# Patient Record
Sex: Female | Born: 1970 | Race: Black or African American | Hispanic: No | Marital: Single | Smoking: Never smoker
Health system: Southern US, Community
[De-identification: ages and names within clinical notes are randomized; demographics above are authoritative.]

## PROBLEM LIST (undated history)

## (undated) DIAGNOSIS — F419 Anxiety disorder, unspecified: Secondary | ICD-10-CM

## (undated) DIAGNOSIS — I10 Essential (primary) hypertension: Secondary | ICD-10-CM

## (undated) HISTORY — PX: TUBAL LIGATION: SHX77

## (undated) HISTORY — DX: Anxiety disorder, unspecified: F41.9

## (undated) HISTORY — DX: Essential (primary) hypertension: I10

## (undated) HISTORY — PX: COLONOSCOPY: SHX174

---

## 1999-05-06 ENCOUNTER — Encounter: Payer: Self-pay | Admitting: Family Medicine

## 1999-05-06 ENCOUNTER — Ambulatory Visit (HOSPITAL_COMMUNITY): Admission: RE | Admit: 1999-05-06 | Discharge: 1999-05-06 | Payer: Self-pay | Admitting: Family Medicine

## 2002-06-08 ENCOUNTER — Encounter: Admission: RE | Admit: 2002-06-08 | Discharge: 2002-06-08 | Payer: Self-pay | Admitting: Obstetrics and Gynecology

## 2002-06-08 ENCOUNTER — Other Ambulatory Visit: Admission: RE | Admit: 2002-06-08 | Discharge: 2002-06-08 | Payer: Self-pay | Admitting: Obstetrics and Gynecology

## 2004-10-23 ENCOUNTER — Ambulatory Visit: Payer: Self-pay | Admitting: Family Medicine

## 2005-05-27 ENCOUNTER — Ambulatory Visit (HOSPITAL_COMMUNITY): Admission: RE | Admit: 2005-05-27 | Discharge: 2005-05-27 | Payer: Self-pay | Admitting: Obstetrics and Gynecology

## 2009-06-27 ENCOUNTER — Ambulatory Visit: Payer: Self-pay | Admitting: Internal Medicine

## 2009-07-25 ENCOUNTER — Ambulatory Visit: Payer: Self-pay | Admitting: Internal Medicine

## 2009-12-23 ENCOUNTER — Encounter (INDEPENDENT_AMBULATORY_CARE_PROVIDER_SITE_OTHER): Payer: Self-pay | Admitting: Family Medicine

## 2009-12-23 LAB — CONVERTED CEMR LAB
ALT: 8 units/L (ref 0–35)
AST: 19 units/L (ref 0–37)
Albumin: 4.5 g/dL (ref 3.5–5.2)
Alkaline Phosphatase: 37 units/L — ABNORMAL LOW (ref 39–117)
BUN: 14 mg/dL (ref 6–23)
Basophils Absolute: 0 10*3/uL (ref 0.0–0.1)
Basophils Relative: 0 % (ref 0–1)
CO2: 25 meq/L (ref 19–32)
Calcium: 9.9 mg/dL (ref 8.4–10.5)
Chloride: 105 meq/L (ref 96–112)
Cholesterol: 170 mg/dL (ref 0–200)
Creatinine, Ser: 0.65 mg/dL (ref 0.40–1.20)
Eosinophils Absolute: 0 10*3/uL (ref 0.0–0.7)
Eosinophils Relative: 1 % (ref 0–5)
Glucose, Bld: 98 mg/dL (ref 70–99)
HCT: 36.8 % (ref 36.0–46.0)
HDL: 64 mg/dL (ref >39)
Hemoglobin: 11.8 g/dL — ABNORMAL LOW (ref 12.0–15.0)
LDL Cholesterol: 95 mg/dL (ref 0–99)
Lymphocytes Relative: 28 % (ref 12–46)
Lymphs Abs: 1.5 10*3/uL (ref 0.7–4.0)
MCHC: 32.1 g/dL (ref 30.0–36.0)
MCV: 94.4 fL (ref 78.0–100.0)
Monocytes Absolute: 0.4 10*3/uL (ref 0.1–1.0)
Monocytes Relative: 7 % (ref 3–12)
Neutro Abs: 3.3 10*3/uL (ref 1.7–7.7)
Neutrophils Relative %: 64 % (ref 43–77)
Platelets: 311 10*3/uL (ref 150–400)
Potassium: 4.2 meq/L (ref 3.5–5.3)
RBC: 3.9 M/uL (ref 3.87–5.11)
RDW: 14.1 % (ref 11.5–15.5)
Sodium: 139 meq/L (ref 135–145)
TSH: 0.598 microintl units/mL (ref 0.350–4.500)
Total Bilirubin: 0.3 mg/dL (ref 0.3–1.2)
Total CHOL/HDL Ratio: 2.7
Total Protein: 6.7 g/dL (ref 6.0–8.3)
Triglycerides: 55 mg/dL (ref <150)
VLDL: 11 mg/dL (ref 0–40)
WBC: 5.2 10*3/uL (ref 4.0–10.5)

## 2010-08-22 NOTE — Group Therapy Note (Signed)
NAME:  Andrea Palmer, Andrea Palmer NO.:  [id number]   MEDICAL RECORD NO.:  [id number]          PATIENT TYPE:  WOC   LOCATION:  [facility]                   FACILITY:  [facility]   PHYSICIAN:  Tinnie Gens, MD        DATE OF BIRTH:  May 17, 1970   DATE OF SERVICE:                                    CLINIC NOTE   CHIEF COMPLAINT:  Yearly exam.   HISTORY OF PRESENT ILLNESS:  The patient is a 40 year old para 2-0-0-2 who  comes in today for a yearly exam.  Her last Pap smear was in March of 2004.  She has not been sexually active since that time, and has no complaints  today.   PAST MEDICAL HISTORY:  Negative.   PAST SURGICAL HISTORY:  BTL.   MEDICATIONS:  None.   ALLERGIES:  None known.   OBSTETRICAL HISTORY:  SVD x 2.   GYNECOLOGICAL HISTORY:  Status post BTL, regular cycles.  Bleeding is normal  for her.  She has a history of chlamydia as a teenager.   FAMILY HISTORY:  Her mom is deceased with coronary artery disease and COPD.  Family history of diabetes and hypertension.   SOCIAL HISTORY:  Positive tobacco, less than a half pack per day.  No  alcohol or drug use.   REVIEW OF SYSTEMS:  A 14-point review of systems was reviewed, and is  negative.   PHYSICAL EXAMINATION:  VITAL SIGNS:  Vital signs are as noted on the chart.  GENERAL:  She is a well-developed, well-nourished, black female in no acute  distress.  ABDOMEN:  Soft, nontender, nondistended.  GU:  She has normal external female genitalia.  The vagina is pink and  rugated.  The cervix is parous and without lesion.  The uterus is between 6  and 8 weeks' size, anteverted, firm, nontender.  The adnexa were without  mass or tenderness.   IMPRESSION:  Yearly exam with Pap.   PLAN:  1.  Pap smear today.  2.  Follow up for yearly Paps or with any problems.  3.  The patient will be scheduled for a mammogram sometime between 34 and      39.       TP/MEDQ  D:  10/23/2004  T:  10/23/2004  Job:  [id number]

## 2013-04-17 ENCOUNTER — Other Ambulatory Visit (HOSPITAL_COMMUNITY)
Admission: RE | Admit: 2013-04-17 | Discharge: 2013-04-17 | Disposition: A | Discharge status: Home / Self Care | Payer: BC Managed Care – PPO | Source: Ambulatory Visit | Attending: Emergency Medicine | Admitting: Emergency Medicine

## 2013-04-17 ENCOUNTER — Emergency Department (HOSPITAL_COMMUNITY)
Admission: EM | Admit: 2013-04-17 | Discharge: 2013-04-17 | Disposition: A | Discharge status: Home / Self Care | Payer: BC Managed Care – PPO | Source: Home / Self Care | Attending: Emergency Medicine | Admitting: Emergency Medicine

## 2013-04-17 ENCOUNTER — Encounter (HOSPITAL_COMMUNITY): Payer: Self-pay | Admitting: Emergency Medicine

## 2013-04-17 DIAGNOSIS — Z113 Encounter for screening for infections with a predominantly sexual mode of transmission: Secondary | ICD-10-CM | POA: Insufficient documentation

## 2013-04-17 DIAGNOSIS — N898 Other specified noninflammatory disorders of vagina: Secondary | ICD-10-CM

## 2013-04-17 DIAGNOSIS — N760 Acute vaginitis: Secondary | ICD-10-CM | POA: Insufficient documentation

## 2013-04-17 LAB — POCT URINALYSIS DIP (DEVICE)
Bilirubin Urine: NEGATIVE
Glucose, UA: NEGATIVE mg/dL
Hgb urine dipstick: NEGATIVE
Ketones, ur: NEGATIVE mg/dL
Leukocytes, UA: NEGATIVE
Nitrite: NEGATIVE
Protein, ur: NEGATIVE mg/dL
Specific Gravity, Urine: 1.01 (ref 1.005–1.030)
Urobilinogen, UA: 0.2 mg/dL (ref 0.0–1.0)
pH: 7 (ref 5.0–8.0)

## 2013-04-17 LAB — POCT PREGNANCY, URINE: Preg Test, Ur: NEGATIVE

## 2013-04-17 LAB — RPR: RPR Ser Ql: NONREACTIVE

## 2013-04-17 LAB — HIV ANTIBODY (ROUTINE TESTING W REFLEX): HIV: NONREACTIVE

## 2013-04-17 NOTE — ED Notes (Signed)
C/o vaginal discharge.  Dryness.  Concerns for std's.   Hx of recurrent yeast and bacterial infection.  Denies pelvic or abdominal pain.  Pt would also like to have std blood work.

## 2013-04-17 NOTE — ED Provider Notes (Signed)
Medical screening examination/treatment/procedure(s) were performed by non-physician practitioner and as supervising physician I was immediately available for consultation/collaboration.  Philipp Deputy, M.D.  Harden Mo, MD 04/17/13 [phone]

## 2013-04-17 NOTE — ED Provider Notes (Signed)
CSN: [id number]     Arrival date & time 04/17/13  1059 History   First MD Initiated Contact with Patient 04/17/13 1242     Chief Complaint  Patient presents with  . Vaginal Discharge   (Consider location/radiation/quality/duration/timing/severity/associated sxs/prior Treatment) HPI Comments: Patient described 3-4 weeks of a "dry, yellow discharge." Denies irregular vaginal bleeding, abdominal or pelvic pain, fever or nausea. No genital lesions. G2P2, s/p BTL.   Patient is a 43 y.o. female presenting with vaginal discharge. The history is provided by the patient.  Vaginal Discharge   History reviewed. No pertinent past medical history. History reviewed. No pertinent past surgical history. History reviewed. No pertinent family history. History  Substance Use Topics  . Smoking status: Never Smoker   . Smokeless tobacco: Not on file  . Alcohol Use: No   OB History   Grav Para Term Preterm Abortions TAB SAB Ect Mult Living                 Review of Systems  Genitourinary: Positive for vaginal discharge.  All other systems reviewed and are negative.    Allergies  Review of patient's allergies indicates no known allergies.  Home Medications  No current outpatient prescriptions on file. BP 142/76  Pulse 70  Temp(Src) 97.6 F (36.4 C) (Oral)  Resp 16  SpO2 100%  LMP 03/22/2013 Physical Exam  Constitutional: She is oriented to person, place, and time. She appears well-developed and well-nourished. No distress.  HENT:  Head: Normocephalic and atraumatic.  Eyes: Conjunctivae are normal. Right eye exhibits no discharge. Left eye exhibits no discharge.  Neck: Normal range of motion. Neck supple.  Cardiovascular: Normal rate.   Pulmonary/Chest: Effort normal. No respiratory distress.  Abdominal: Soft. She exhibits no distension and no mass. There is no tenderness.  Genitourinary: Vagina normal and uterus normal. Pelvic exam was performed with patient supine. There is no rash,  tenderness or lesion on the right labia. There is no rash, tenderness or lesion on the left labia. Cervix exhibits no motion tenderness, no discharge and no friability. Right adnexum displays no mass, no tenderness and no fullness. Left adnexum displays no mass, no tenderness and no fullness.  Musculoskeletal: Normal range of motion.  Neurological: She is alert and oriented to person, place, and time.  Skin: Skin is warm and dry. No rash noted.  Psychiatric: She has a normal mood and affect. Her behavior is normal.    ED Course  Procedures (including critical care time) Labs Review Labs Reviewed  RPR  HIV ANTIBODY (ROUTINE TESTING)  CERVICOVAGINAL ANCILLARY ONLY   Imaging Review No results found.  EKG Interpretation    Date/Time:    Ventricular Rate:    PR Interval:    QRS Duration:   QT Interval:    QTC Calculation:   R Axis:     Text Interpretation:              MDM  Vaginal specimens collected. Patient wishes to wait on results to determine if treatment necessary. Instructed patient she would be notified by phone if results indicated need for treatment.     [place], [place] 04/17/13 1311

## 2013-04-20 ENCOUNTER — Telehealth (HOSPITAL_COMMUNITY): Payer: Self-pay | Admitting: Physician Assistant

## 2013-04-20 MED ORDER — METRONIDAZOLE 500 MG PO TABS: 500.0000 mg | ORAL_TABLET | Freq: Two times a day (BID) | ORAL | Status: DC

## 2013-04-20 NOTE — Telephone Encounter (Signed)
Message copied by Lutricia Feil on Thu Apr 20, 2013  8:09 AM ------      Message from: Roselyn Meier      Created: Wed Apr 19, 2013  9:44 PM      Regarding: labs       Gardnerella pos. Rest of labs neg. You had sent me a message that we discussed last night. You decided to treat after reviewing your note.      Roselyn Meier      04/19/2013       ------

## 2013-04-20 NOTE — Telephone Encounter (Signed)
Patient seen for vaginal discharge and cultures +for BV. Will prescribe metronidazole 500mg  po BID x 7 days and advised GYN follow up if no improvement.

## 2013-04-23 ENCOUNTER — Telehealth (HOSPITAL_COMMUNITY): Payer: Self-pay | Admitting: *Deleted

## 2013-04-23 NOTE — ED Notes (Addendum)
GC/Chlamydia neg., Affirm: Candida and Trich neg., Gardnerella pos., HIV/RPR non-reactive.  Dot Been PA wrote that pt. can be treated if symptomatic.  I called pt. and left a message to call. Call 1. 04/23/13 I called pt. Pt. verified x 2 and given results.  Pt. said she does not have any symptoms now. I told her if she notes symptoms this week to call back and I could call in the Rx. After 1 week, she would need to be rechecked. Pt. voiced understanding. Roselyn Meier 04/24/2013

## 2014-02-05 ENCOUNTER — Encounter: Payer: Self-pay | Admitting: Family

## 2014-02-05 ENCOUNTER — Ambulatory Visit (INDEPENDENT_AMBULATORY_CARE_PROVIDER_SITE_OTHER): Payer: BC Managed Care – PPO | Admitting: Family

## 2014-02-05 VITALS — BP 110/88 | HR 67 | Temp 98.1°F | Resp 18 | Ht 62.0 in | Wt 127.1 lb

## 2014-02-05 DIAGNOSIS — N63 Unspecified lump in breast: Secondary | ICD-10-CM | POA: Diagnosis not present

## 2014-02-05 DIAGNOSIS — N6325 Unspecified lump in the left breast, overlapping quadrants: Secondary | ICD-10-CM | POA: Insufficient documentation

## 2014-02-05 DIAGNOSIS — N6320 Unspecified lump in the left breast, unspecified quadrant: Principal | ICD-10-CM

## 2014-02-05 NOTE — Patient Instructions (Signed)
Thank you for choosing [facility].  Summary/Instructions:   Please schedule a time for a physical/PAP smear  We will schedule a time for your mammogram at your physical - keep track of lump at this time.

## 2014-02-05 NOTE — Progress Notes (Signed)
Pre visit review using our clinic review tool, if applicable. No additional management support is needed unless otherwise documented below in the visit note. 

## 2014-02-05 NOTE — Assessment & Plan Note (Signed)
Most likely cycle related and is benign. No appreciable lump/mass noted on exam. Continue to monitor if lump returns or increases in size. Will order preventative mammorgram during physical in 2 weeks. Continue to monitor. Follow before 2 weeks if symptoms worsen or fail to improve.

## 2014-02-05 NOTE — Progress Notes (Signed)
   Subjective:    Patient ID: Andrea Palmer, female    DOB: 01-09-1971, 43 y.o.   MRN: [id number]  Chief Complaint  Patient presents with  . Establish Care    has lump on breast that she wants to get checked    HPI:  Andrea Palmer is a 43 y.o. female who presents today to establish care and discuss a lump in her breast.    1) Lump in breast - first noticed last Thursday when a grand-baby was pushing on her breast. Describes breast soreness and noticed a small lump which has decreased since then. Notes finishing her period last week and decrease in size coincides with this. Lump was noted at approximately 3 o'clock position on left breast. Describes the lump as discrete and mobile. Denies any dimpling of breast, changes in skin, or discharge from nipple. Does note some soreness of breasts bilaterally. Has not done any treatments, and notes there is nothing that makes it better or worse.   No Known Allergies  No current outpatient prescriptions on file prior to visit.   No current facility-administered medications on file prior to visit.   No past medical history on file.   Review of Systems    See HPI  Objective:    BP 110/88 mmHg  Pulse 67  Temp(Src) 98.1 F (36.7 C) (Oral)  Resp 18  Ht 5\' 2"  (1.575 m)  Wt 127 lb 1.9 oz (57.661 kg)  BMI 23.24 kg/m2  SpO2 98% Nursing note and vital signs reviewed.  Physical Exam  Constitutional: She is oriented to person, place, and time. She appears well-developed and well-nourished. No distress.  Cardiovascular: Normal rate, regular rhythm, normal heart sounds and intact distal pulses.   Pulmonary/Chest: Effort normal and breath sounds normal. Right breast exhibits no inverted nipple, no mass, no nipple discharge, no skin change and no tenderness. Left breast exhibits no inverted nipple, no mass, no nipple discharge, no skin change and no tenderness.  No appreciable lump noted bilaterally.  Neurological: She is alert and oriented to  person, place, and time.  Skin: Skin is warm and dry.  Psychiatric: She has a normal mood and affect. Her behavior is normal. Judgment and thought content normal.       Assessment & Plan:

## 2014-02-15 ENCOUNTER — Encounter: Payer: BC Managed Care – PPO | Admitting: Family

## 2014-03-06 ENCOUNTER — Other Ambulatory Visit (HOSPITAL_COMMUNITY): Payer: Self-pay | Admitting: Obstetrics and Gynecology

## 2014-03-06 DIAGNOSIS — Z1231 Encounter for screening mammogram for malignant neoplasm of breast: Secondary | ICD-10-CM

## 2014-03-22 ENCOUNTER — Ambulatory Visit (HOSPITAL_COMMUNITY): Payer: BC Managed Care – PPO

## 2014-04-05 ENCOUNTER — Ambulatory Visit (HOSPITAL_COMMUNITY)
Admission: RE | Admit: 2014-04-05 | Discharge: 2014-04-05 | Disposition: A | Discharge status: Home / Self Care | Payer: BC Managed Care – PPO | Source: Ambulatory Visit | Attending: Obstetrics and Gynecology | Admitting: Obstetrics and Gynecology

## 2014-04-05 DIAGNOSIS — Z1231 Encounter for screening mammogram for malignant neoplasm of breast: Secondary | ICD-10-CM

## 2017-04-29 ENCOUNTER — Other Ambulatory Visit: Payer: Self-pay

## 2017-04-29 ENCOUNTER — Encounter: Payer: Self-pay | Admitting: Family Medicine

## 2017-04-29 ENCOUNTER — Ambulatory Visit: Payer: BLUE CROSS/BLUE SHIELD | Admitting: Family Medicine

## 2017-04-29 VITALS — BP 130/84 | HR 80 | Temp 98.2°F | Resp 18 | Ht 63.0 in | Wt 125.8 lb

## 2017-04-29 DIAGNOSIS — N938 Other specified abnormal uterine and vaginal bleeding: Secondary | ICD-10-CM

## 2017-04-29 DIAGNOSIS — R61 Generalized hyperhidrosis: Secondary | ICD-10-CM

## 2017-04-29 DIAGNOSIS — Z136 Encounter for screening for cardiovascular disorders: Secondary | ICD-10-CM | POA: Diagnosis not present

## 2017-04-29 DIAGNOSIS — Z23 Encounter for immunization: Secondary | ICD-10-CM | POA: Diagnosis not present

## 2017-04-29 LAB — POCT URINALYSIS DIP (MANUAL ENTRY)
BILIRUBIN UA: NEGATIVE mg/dL
Bilirubin, UA: NEGATIVE
Glucose, UA: NEGATIVE mg/dL
LEUKOCYTES UA: NEGATIVE
NITRITE UA: POSITIVE — AB
PROTEIN UA: NEGATIVE mg/dL
Spec Grav, UA: 1.015 (ref 1.010–1.025)
UROBILINOGEN UA: 0.2 U/dL
pH, UA: 7 (ref 5.0–8.0)

## 2017-04-29 LAB — POCT URINE PREGNANCY: Preg Test, Ur: NEGATIVE

## 2017-04-29 LAB — POC MICROSCOPIC URINALYSIS (UMFC): Mucus: ABSENT

## 2017-04-29 NOTE — Progress Notes (Signed)
Subjective:    Patient ID: Andrea Palmer, female    DOB: 15-Dec-1970, 47 y.o.   MRN: [id number] Chief Complaint  Patient presents with  . Establish Care    Pt states she would like to get to know the provider and would like discuss menopause.    HPI  Andrea Palmer is a 47 yo woman here to establish care.   She missed her period in December but now she started it yesterday but is very light which is atypical for her.  She started having some hot flashes last month during the time where she should have had her period.  The hot flashes would last for 3-4 min and then be cold. She wasn't sweating a lot during these. She did develop night sweats where she is just drenched 3-4 yrs ago but was told 2 yrs ago that she was NOT perimenopausal from the lining of her uterus by her gyn.  She doesn't feel "balanced" today. No h/o any prior abnml pap smears, but does have recurrent yeast infections right before the period.  Is s/p BTL.  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 No breast tenderness. Has had some mood changes where she tends to get a little more sad right before her period.  No changes in hair or skin.  Is constipated at baseline her whole life - most notably after she had kids If fasting today.   Works as a Probation officer.   Anxiety - never needed any meds or therapy prior.   No h/o any pulm or cardiac problems or sxs. Normal urine.  Takes pampirin prn for periods or asa but no other supp/herbals/otc meds.  Doesn't like meds or drs much.   Mother had a hysterectomy so don't know about her menopause hx but her older  Periods come every 28d - occ up to 26-30d - and lasts for 7d-10d though the lasts days are spottig - only 2-3 days of heavy flow.    Past Medical History:  Diagnosis Date  . Anxiety    Past Surgical History:  Procedure Laterality Date  . TUBAL LIGATION     No current outpatient medications on file prior  to visit.   No current facility-administered medications on file prior to visit.    No Known Allergies Family History  Problem Relation Age of Onset  . Congestive Heart Failure Mother    Social History   Socioeconomic History  . Marital status: Single    Spouse name: None  . Number of children: 2  . Years of education: 26  . Highest education level: None  Social Needs  . Financial resource strain: None  . Food insecurity - worry: None  . Food insecurity - inability: None  . Transportation needs - medical: None  . Transportation needs - non-medical: None  Occupational History  . Occupation: Hair stylist   Tobacco Use  . Smoking status: Never Smoker  . Smokeless tobacco: Never Used  Substance and Sexual Activity  . Alcohol use: No    Alcohol/week: 0.0 oz  . Drug use: No  . Sexual activity: Yes    Partners: Male    Birth control/protection: None  Other Topics Concern  . None  Social History Narrative   Born and raised in [place]. Currently resides in an apartment with her son. Has a rabbit. Fun: watch tv (law and order; criminal minds); Denies any religious beliefs that would  effect health care.   Depression screen PHQ 2/9 04/29/2017  Decreased Interest 0  Down, Depressed, Hopeless 0  PHQ - 2 Score 0    Review of Systems See hpi    Objective:   Physical Exam  Constitutional: She is oriented to person, place, and time. She appears well-developed and well-nourished. No distress.  HENT:  Head: Normocephalic and atraumatic.  Right Ear: External ear normal.  Left Ear: External ear normal.  Eyes: Conjunctivae are normal. No scleral icterus.  Neck: Normal range of motion. Neck supple. No thyromegaly present.  Cardiovascular: Normal rate, regular rhythm, normal heart sounds and intact distal pulses.  Pulmonary/Chest: Effort normal and breath sounds normal. No respiratory distress.  Musculoskeletal: She exhibits no edema.  Lymphadenopathy:    She has no cervical  adenopathy.  Neurological: She is alert and oriented to person, place, and time.  Skin: Skin is warm and dry. She is not diaphoretic. No erythema.  Psychiatric: She has a normal mood and affect. Her behavior is normal.      BP 130/84 (BP Location: Left Arm, Patient Position: Sitting, Cuff Size: Normal)   Pulse 80   Temp 98.2 F (36.8 C) (Oral)   Resp 18   Ht 5\' 3"  (1.6 m)   Wt 125 lb 12.8 oz (57.1 kg)   LMP 02/13/2017   SpO2 99%   BMI 22.28 kg/m      Assessment & Plan:   1. Dysfunctional uterine bleeding   2. Need for Tdap vaccination   3. Night sweats   4. Screening for cardiovascular condition     Orders Placed This Encounter  Procedures  . Tdap vaccine greater than or equal to 7yo IM  . CBC with Differential/Platelet  . Comprehensive metabolic panel  . TSH  . FSH/LH  . Prolactin  . Lipid panel    Order Specific Question:   Has the patient fasted?    Answer:   Yes  . POCT urine pregnancy  . POCT urinalysis dipstick  . POCT Microscopic Urinalysis ([facility])     Delman Cheadle, M.D.  Primary Care at [facility] [address], [place] [address] [phone] phone [phone] fax  04/29/17 11:40 AM

## 2017-04-29 NOTE — Patient Instructions (Addendum)
   IF you received an x-ray today, you will receive an invoice from [facility]. Please contact [facility] at [phone] with questions or concerns regarding your invoice.   IF you received labwork today, you will receive an invoice from [facility]. Please contact [facility] at [phone] with questions or concerns regarding your invoice.   Our billing staff will not be able to assist you with questions regarding bills from these companies.  You will be contacted with the lab results as soon as they are available. The fastest way to get your results is to activate your My Chart account. Instructions are located on the last page of this paperwork. If you have not heard from us regarding the results in 2 weeks, please contact this office.    Perimenopause Perimenopause is the time when your body begins to move into the menopause (no menstrual period for 12 straight months). It is a natural process. Perimenopause can begin 2-8 years before the menopause and usually lasts for 1 year after the menopause. During this time, your ovaries may or may not produce an egg. The ovaries vary in their production of estrogen and progesterone hormones each month. This can cause irregular menstrual periods, difficulty getting pregnant, vaginal bleeding between periods, and uncomfortable symptoms. What are the causes?  Irregular production of the ovarian hormones, estrogen and progesterone, and not ovulating every month. Other causes include:  Tumor of the pituitary gland in the brain.  Medical disease that affects the ovaries.  Radiation treatment.  Chemotherapy.  Unknown causes.  Heavy smoking and excessive alcohol intake can bring on perimenopause sooner.  What are the signs or symptoms?  Hot flashes.  Night sweats.  Irregular menstrual periods.  Decreased sex drive.  Vaginal dryness.  Headaches.  Mood swings.  Depression.  Memory  problems.  Irritability.  Tiredness.  Weight gain.  Trouble getting pregnant.  The beginning of losing bone cells (osteoporosis).  The beginning of hardening of the arteries (atherosclerosis). How is this diagnosed? Your health care provider will make a diagnosis by analyzing your age, menstrual history, and symptoms. He or she will do a physical exam and note any changes in your body, especially your female organs. Female hormone tests may or may not be helpful depending on the amount of female hormones you produce and when you produce them. However, other hormone tests may be helpful to rule out other problems. How is this treated? In some cases, no treatment is needed. The decision on whether treatment is necessary during the perimenopause should be made by you and your health care provider based on how the symptoms are affecting you and your lifestyle. Various treatments are available, such as:  Treating individual symptoms with a specific medicine for that symptom.  Herbal medicines that can help specific symptoms.  Counseling.  Group therapy.  Follow these instructions at home:  Keep track of your menstrual periods (when they occur, how heavy they are, how long between periods, and how long they last) as well as your symptoms and when they started.  Only take over-the-counter or prescription medicines as directed by your health care provider.  Sleep and rest.  Exercise.  Eat a diet that contains calcium (good for your bones) and soy (acts like the estrogen hormone).  Do not smoke.  Avoid alcoholic beverages.  Take vitamin supplements as recommended by your health care provider. Taking vitamin E may help in certain cases.  Take calcium and vitamin D supplements to help prevent bone loss.    Group therapy is sometimes helpful.  Acupuncture may help in some cases. Contact a health care provider if:  You have questions about any symptoms you are having.  You need  a referral to a specialist (gynecologist, psychiatrist, or psychologist). Get help right away if:  You have vaginal bleeding.  Your period lasts longer than 8 days.  Your periods are recurring sooner than 21 days.  You have bleeding after intercourse.  You have severe depression.  You have pain when you urinate.  You have severe headaches.  You have vision problems. This information is not intended to replace advice given to you by your health care provider. Make sure you discuss any questions you have with your health care provider. Document Released: 04/30/2004 Document Revised: 08/29/2015 Document Reviewed: 10/20/2012 Elsevier Interactive Patient Education  2017 [employer] Inc[employer]  

## 2017-04-30 LAB — COMPREHENSIVE METABOLIC PANEL
ALT: 8 IU/L (ref 0–32)
AST: 14 IU/L (ref 0–40)
Albumin/Globulin Ratio: 2 (ref 1.2–2.2)
Albumin: 4.4 g/dL (ref 3.5–5.5)
Alkaline Phosphatase: 47 IU/L (ref 39–117)
BUN/Creatinine Ratio: 17 (ref 9–23)
BUN: 14 mg/dL (ref 6–24)
Bilirubin Total: 0.3 mg/dL (ref 0.0–1.2)
CO2: 22 mmol/L (ref 20–29)
Calcium: 9.7 mg/dL (ref 8.7–10.2)
Chloride: 103 mmol/L (ref 96–106)
Creatinine, Ser: 0.81 mg/dL (ref 0.57–1.00)
GFR calc Af Amer: 100 mL/min/{1.73_m2} (ref >59)
GFR calc non Af Amer: 87 mL/min/{1.73_m2} (ref >59)
Globulin, Total: 2.2 g/dL (ref 1.5–4.5)
Glucose: 82 mg/dL (ref 65–99)
Potassium: 4.3 mmol/L (ref 3.5–5.2)
Sodium: 141 mmol/L (ref 134–144)
TOTAL PROTEIN: 6.6 g/dL (ref 6.0–8.5)

## 2017-04-30 LAB — CBC WITH DIFFERENTIAL/PLATELET
BASOS: 0 %
Basophils Absolute: 0 10*3/uL (ref 0.0–0.2)
EOS (ABSOLUTE): 0 10*3/uL (ref 0.0–0.4)
EOS: 0 %
Hematocrit: 39.2 % (ref 34.0–46.6)
Hemoglobin: 12.9 g/dL (ref 11.1–15.9)
IMMATURE GRANS (ABS): 0 10*3/uL (ref 0.0–0.1)
IMMATURE GRANULOCYTES: 0 %
LYMPHS: 32 %
Lymphocytes Absolute: 1.5 10*3/uL (ref 0.7–3.1)
MCH: 32.5 pg (ref 26.6–33.0)
MCHC: 32.9 g/dL (ref 31.5–35.7)
MCV: 99 fL — AB (ref 79–97)
MONOCYTES: 6 %
Monocytes Absolute: 0.3 10*3/uL (ref 0.1–0.9)
Neutrophils Absolute: 2.9 10*3/uL (ref 1.4–7.0)
Neutrophils: 62 %
PLATELETS: 275 10*3/uL (ref 150–379)
RBC: 3.97 x10E6/uL (ref 3.77–5.28)
RDW: 13.5 % (ref 12.3–15.4)
WBC: 4.7 10*3/uL (ref 3.4–10.8)

## 2017-04-30 LAB — LIPID PANEL
Chol/HDL Ratio: 2.6 ratio (ref 0.0–4.4)
Cholesterol, Total: 210 mg/dL — ABNORMAL HIGH (ref 100–199)
HDL: 82 mg/dL (ref >39)
LDL Calculated: 118 mg/dL — ABNORMAL HIGH (ref 0–99)
TRIGLYCERIDES: 49 mg/dL (ref 0–149)
VLDL Cholesterol Cal: 10 mg/dL (ref 5–40)

## 2017-04-30 LAB — FSH/LH
FSH: 10.1 m[IU]/mL
LH: 10.6 m[IU]/mL

## 2017-04-30 LAB — PROLACTIN: PROLACTIN: 9.2 ng/mL (ref 4.8–23.3)

## 2017-04-30 LAB — TSH: TSH: 1.2 u[IU]/mL (ref 0.450–4.500)

## 2019-05-16 ENCOUNTER — Other Ambulatory Visit: Payer: Self-pay

## 2019-05-16 ENCOUNTER — Encounter: Payer: Self-pay | Admitting: Adult Health Nurse Practitioner

## 2019-05-16 ENCOUNTER — Ambulatory Visit: Payer: 59 | Admitting: Adult Health Nurse Practitioner

## 2019-05-16 VITALS — BP 134/83 | HR 82 | Temp 98.1°F | Ht 62.0 in | Wt 138.0 lb

## 2019-05-16 DIAGNOSIS — I10 Essential (primary) hypertension: Secondary | ICD-10-CM | POA: Insufficient documentation

## 2019-05-16 DIAGNOSIS — F411 Generalized anxiety disorder: Secondary | ICD-10-CM | POA: Insufficient documentation

## 2019-05-16 MED ORDER — PAROXETINE HCL 20 MG PO TABS: 20.0000 mg | ORAL_TABLET | Freq: Every day | ORAL | 1 refills | Status: DC

## 2019-05-16 MED ORDER — CLONAZEPAM 0.5 MG PO TABS: 0.5000 mg | ORAL_TABLET | Freq: Two times a day (BID) | ORAL | 1 refills | Status: DC | PRN

## 2019-05-16 MED ORDER — LISINOPRIL-HYDROCHLOROTHIAZIDE 10-12.5 MG PO TABS: 1.0000 | ORAL_TABLET | Freq: Every day | ORAL | 3 refills | Status: DC

## 2019-05-16 NOTE — Patient Instructions (Signed)
° ° ° °  If you have lab work done today you will be contacted with your lab results within the next 2 weeks.  If you have not heard from us then please contact us. The fastest way to get your results is to register for My Chart. ° ° °IF you received an x-ray today, you will receive an invoice from [facility]. Please contact [facility] at [phone] with questions or concerns regarding your invoice.  ° °IF you received labwork today, you will receive an invoice from [facility]. Please contact [facility] at [phone] with questions or concerns regarding your invoice.  ° °Our billing staff will not be able to assist you with questions regarding bills from these companies. ° °You will be contacted with the lab results as soon as they are available. The fastest way to get your results is to activate your My Chart account. Instructions are located on the last page of this paperwork. If you have not heard from us regarding the results in 2 weeks, please contact this office. °  ° ° ° °

## 2019-05-16 NOTE — Progress Notes (Signed)
Chief Complaint  Patient presents with  . Establish Care    Concerns with BP. Pt stated that the amolodipine has her fingers and feet tingle.    HPI   Patient is having anxiety and labile Bps that could be contributing.  Notes that when getting up from sitting, sometimes feels lightheaded and has to get up slow.  She checks her BP at home.  She has had anxiety her whole life and at this point,is amenable to starting medication to help with her anxiety.  She finds herself with racing thoughts at times and chest pain from increased HR.  Denies depression.   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  She does have adequate social support.  Problem List    Problem List: 2015-11: Breast lump on left side at 3 o'clock position   Allergies   has No Known Allergies.  Medications    Current Outpatient Medications:  .  amLODipine (NORVASC) 10 MG tablet, Take by mouth., Disp: , Rfl:  .  clonazePAM (KLONOPIN) 0.5 MG tablet, Take 1 tablet (0.5 mg total) by mouth 2 (two) times daily as needed for anxiety., Disp: 30 tablet, Rfl: 1 .  lisinopril-hydrochlorothiazide (ZESTORETIC) 10-12.5 MG tablet, Take 1 tablet by mouth daily., Disp: 90 tablet, Rfl: 3 .  PARoxetine (PAXIL) 20 MG tablet, Take 1 tablet (20 mg total) by mouth daily., Disp: 30 tablet, Rfl: 1   Review of Systems    Constitutional: Negative for activity change, appetite change, chills and fever.  HENT: Negative for congestion, nosebleeds, trouble swallowing and voice change.   Respiratory: Negative for cough, shortness of breath and wheezing.   Cardiac:  Negative for chest pain, pressure, syncope  Gastrointestinal: Negative for diarrhea, nausea and vomiting.  Genitourinary: Negative for difficulty urinating, dysuria, flank pain and hematuria.  Musculoskeletal: Negative for back pain, joint swelling and neck pain.  Neurological: Negative for dizziness, speech difficulty, light-headedness and numbness.  See HPI.  All other review of systems negative.     Physical Exam:    height is '5\' 2"'  (1.575 m) and weight is 138 lb (62.6 kg). Her temporal temperature is 98.1 F (36.7 C). Her blood pressure is 134/83 and her pulse is 82. Her oxygen saturation is 99%.   Physical Examination: General appearance - alert, well appearing, and in no distress and oriented to person, place, and time Mental status - normal mood, behavior, speech, dress, motor activity, and thought processes Eyes - PERRL. Extraocular movements intact.  No nystagmus.  Neck - supple, no significant adenopathy, carotids upstroke normal bilaterally, no bruits, thyroid exam: thyroid is normal in size without nodules or tenderness Chest - clear to auscultation, no wheezes, rales or rhonchi, symmetric air entry  Heart - normal rate, regular rhythm, normal S1, S2, no murmurs, rubs, clicks or gallops Extremities - dependent LE edema without clubbing or cyanosis Skin - normal coloration and turgor, no rashes, no suspicious skin lesions noted  No hyperpigmentation of skin.  No current hematomas noted   Lab /Imaging Review    orders written for new lab studies as appropriate; see orders.   Assessment & Plan:  Andrea Palmer is a 49 y.o. female    1. Essential hypertension    Orders Placed This Encounter  Procedures  . CMP14+EGFR  . TSH   Meds ordered this encounter  Medications  . lisinopril-hydrochlorothiazide (ZESTORETIC) 10-12.5 MG tablet    Sig: Take 1 tablet by mouth daily.    Dispense:  90 tablet    Refill:  3  . PARoxetine (PAXIL) 20 MG tablet    Sig: Take 1 tablet (20 mg total) by mouth daily.    Dispense:  30 tablet    Refill:  1  . clonazePAM (KLONOPIN) 0.5 MG tablet    Sig: Take 1 tablet (0.5 mg total) by mouth 2 (two) times daily as needed for anxiety.    Dispense:  30 tablet    Refill:  1   Changed BP medication as noted above.  Started on an SSRI with prn Klonopin for anxiety attacks.  Educated about taking SSRI  daily with only prn Klonopin reserved for extreme anxiety.  Discussed tolerance and dependence and patient verbalized understanding.   Will f/u in 2-4 weeks for BP check and f/u anxiety.  She is inline with this plan.     Glyn Ade, NP

## 2019-05-17 LAB — CMP14+EGFR
ALT: 8 IU/L (ref 0–32)
AST: 17 IU/L (ref 0–40)
Albumin/Globulin Ratio: 2.1 (ref 1.2–2.2)
Albumin: 4.8 g/dL (ref 3.8–4.8)
Alkaline Phosphatase: 63 IU/L (ref 39–117)
BUN/Creatinine Ratio: 16 (ref 9–23)
BUN: 13 mg/dL (ref 6–24)
Bilirubin Total: <0.2 mg/dL (ref 0.0–1.2)
CO2: 22 mmol/L (ref 20–29)
Calcium: 10.4 mg/dL — ABNORMAL HIGH (ref 8.7–10.2)
Chloride: 102 mmol/L (ref 96–106)
Creatinine, Ser: 0.83 mg/dL (ref 0.57–1.00)
GFR calc Af Amer: 96 mL/min/{1.73_m2} (ref >59)
GFR calc non Af Amer: 83 mL/min/{1.73_m2} (ref >59)
Globulin, Total: 2.3 g/dL (ref 1.5–4.5)
Glucose: 85 mg/dL (ref 65–99)
Potassium: 4 mmol/L (ref 3.5–5.2)
Sodium: 138 mmol/L (ref 134–144)
Total Protein: 7.1 g/dL (ref 6.0–8.5)

## 2019-05-17 LAB — TSH: TSH: 1.6 u[IU]/mL (ref 0.450–4.500)

## 2019-06-06 ENCOUNTER — Encounter: Payer: Self-pay | Admitting: Adult Health Nurse Practitioner

## 2019-06-06 ENCOUNTER — Other Ambulatory Visit: Payer: Self-pay

## 2019-06-06 ENCOUNTER — Ambulatory Visit: Payer: 59 | Admitting: Adult Health Nurse Practitioner

## 2019-06-06 VITALS — BP 139/87 | HR 93 | Temp 98.0°F | Ht 62.0 in | Wt 142.2 lb

## 2019-06-06 DIAGNOSIS — F411 Generalized anxiety disorder: Secondary | ICD-10-CM | POA: Diagnosis not present

## 2019-06-06 DIAGNOSIS — I10 Essential (primary) hypertension: Secondary | ICD-10-CM

## 2019-06-06 MED ORDER — ENALAPRIL MALEATE 5 MG PO TABS: 5.0000 mg | ORAL_TABLET | Freq: Every day | ORAL | 3 refills | Status: DC

## 2019-06-06 NOTE — Progress Notes (Signed)
     06/06/2019  Andrea Palmer Andrea Palmer 01-22-1971 [id number]   Subjective:    Patient here for follow-up of elevated blood pressure.  She is exercising and is adherent to a low-salt diet.  Blood pressure is well controlled at home. Cardiac symptoms: none. Patient denies: dyspnea, exertional chest pressure/discomfort and fatigue. Cardiovascular risk factors: hypertension. Use of agents associated with hypertension: none. History of target organ damage: none.   She did not feel good on the combined Lisinopril/HCTZ.  Took Amlodipine prn for Bps greater than 139.  Would take BP anytime she thought was high.  It did not run over 140 according to patient.  She felt like the combination medication made her very tired and wiped out.  She wishes to change medications at this time.  Has been managing BP also with relaxation tea at night and trying not to get upset about things as much.    Also presents for follow up of anxiety disorder. Current symptoms: none. She denies current suicidal and homicidal ideation. She complains of the following side effects from the treatment: none.  She has been using the Klonopin sparingly.  Has not used the Paxil except prn.  We discussed how Paxil is not a prn and if she decides to take it or anxiety gets worse, would need to take everyday for the medication to be effective.  She verbalized understanding.   The following portions of the patient's history were reviewed and updated as appropriate: allergies, current medications, past family history, past medical history, past social history, past surgical history and problem list.      The following portions of the patient's history were reviewed and updated as appropriate: allergies, current medications, past family history, past medical history and past social history.  Review of Systems Constitutional: negative for chills, malaise, night sweats and weight loss Respiratory: negative for cough, dyspnea on exertion and  wheezing Cardiovascular: negative for chest pain, chest pressure/discomfort and lower extremity edema Behavioral/Psych: negative except for anxiety Endocrine: negative for diabetic symptoms including polydipsia, polyphagia and polyuria and temperature intolerance     Objective:    BP 139/87 (BP Location: Right Arm, Patient Position: Sitting, Cuff Size: Normal)   Pulse 93   Temp 98 F (36.7 C) (Temporal)   Ht 5\' 2"  (1.575 m)   Wt 142 lb 3.2 oz (64.5 kg)   LMP 04/22/2019   SpO2 97%   BMI 26.01 kg/m  General appearance: alert and cooperative  General:  cooperative and no distress  Affect/Behavior:  good insight, normal perception, normal reasoning, normal speech pattern and content and normal thought patterns a      Lungs: clear to auscultation bilaterally Heart: regular rate and rhythm, S1, S2 normal, no murmur, click, rub or gallop Extremities: extremities normal, atraumatic, no cyanosis or edema Pulses: 2+ and symmetric Skin: Skin color, texture, turgor normal. No rashes or lesions        Assessment:    Hypertension, stage 1 Evidence of target organ damage: none.   1. Essential hypertension   2. Generalized anxiety disorder       Plan:    Medication: discontinue Lisinopril/HCTZ and begin Enlapril 5mg  . Check blood pressures daily and record. Follow up: 1 month and as needed.    Will check fasting cholesterol labs when she returns and repeat BMP.  She is inline with this plan.   Glyn Ade, NP

## 2019-06-06 NOTE — Patient Instructions (Signed)
° ° ° °  If you have lab work done today you will be contacted with your lab results within the next 2 weeks.  If you have not heard from us then please contact us. The fastest way to get your results is to register for My Chart. ° ° °IF you received an x-ray today, you will receive an invoice from [facility]. Please contact [facility] at [phone] with questions or concerns regarding your invoice.  ° °IF you received labwork today, you will receive an invoice from [facility]. Please contact [facility] at [phone] with questions or concerns regarding your invoice.  ° °Our billing staff will not be able to assist you with questions regarding bills from these companies. ° °You will be contacted with the lab results as soon as they are available. The fastest way to get your results is to activate your My Chart account. Instructions are located on the last page of this paperwork. If you have not heard from us regarding the results in 2 weeks, please contact this office. °  ° ° ° °

## 2019-07-05 ENCOUNTER — Other Ambulatory Visit: Payer: Self-pay

## 2019-07-05 ENCOUNTER — Ambulatory Visit (INDEPENDENT_AMBULATORY_CARE_PROVIDER_SITE_OTHER): Payer: 59 | Admitting: Adult Health Nurse Practitioner

## 2019-07-05 VITALS — BP 135/83 | HR 68 | Temp 97.9°F | Ht 62.0 in | Wt 139.6 lb

## 2019-07-05 DIAGNOSIS — I10 Essential (primary) hypertension: Secondary | ICD-10-CM

## 2019-07-05 DIAGNOSIS — F411 Generalized anxiety disorder: Secondary | ICD-10-CM

## 2019-07-05 DIAGNOSIS — R109 Unspecified abdominal pain: Secondary | ICD-10-CM

## 2019-07-05 LAB — LIPID PANEL
Chol/HDL Ratio: 2.6 ratio (ref 0.0–4.4)
Cholesterol, Total: 206 mg/dL — ABNORMAL HIGH (ref 100–199)
HDL: 78 mg/dL (ref >39)
LDL Chol Calc (NIH): 116 mg/dL — ABNORMAL HIGH (ref 0–99)
Triglycerides: 67 mg/dL (ref 0–149)
VLDL Cholesterol Cal: 12 mg/dL (ref 5–40)

## 2019-07-05 LAB — CMP14+EGFR
ALT: 13 IU/L (ref 0–32)
AST: 19 IU/L (ref 0–40)
Albumin/Globulin Ratio: 1.8 (ref 1.2–2.2)
Albumin: 4.4 g/dL (ref 3.8–4.8)
Alkaline Phosphatase: 54 IU/L (ref 39–117)
BUN/Creatinine Ratio: 17 (ref 9–23)
BUN: 12 mg/dL (ref 6–24)
Bilirubin Total: 0.3 mg/dL (ref 0.0–1.2)
CO2: 24 mmol/L (ref 20–29)
Calcium: 10 mg/dL (ref 8.7–10.2)
Chloride: 104 mmol/L (ref 96–106)
Creatinine, Ser: 0.72 mg/dL (ref 0.57–1.00)
GFR calc Af Amer: 114 mL/min/{1.73_m2} (ref >59)
GFR calc non Af Amer: 99 mL/min/{1.73_m2} (ref >59)
Globulin, Total: 2.4 g/dL (ref 1.5–4.5)
Glucose: 79 mg/dL (ref 65–99)
Potassium: 4 mmol/L (ref 3.5–5.2)
Sodium: 139 mmol/L (ref 134–144)
Total Protein: 6.8 g/dL (ref 6.0–8.5)

## 2019-07-05 MED ORDER — METOPROLOL SUCCINATE ER 25 MG PO TB24: 25.0000 mg | ORAL_TABLET | Freq: Every day | ORAL | 3 refills | Status: DC

## 2019-07-05 MED ORDER — OLANZAPINE 5 MG PO TABS: 5.0000 mg | ORAL_TABLET | Freq: Every day | ORAL | 2 refills | Status: DC

## 2019-07-05 NOTE — Progress Notes (Signed)
Anxiety Subjective:     Andrea Palmer is a 48 y.o. female who presents for follow up of anxiety disorder. Current symptoms: none. She denies current suicidal and homicidal ideation. She complains about previous SSRI treatment.  Per last visit anxiety was improved and BP was well controlled that she felt she didn't need a daily SSRI.  Used Klonopin only sparingly.   Anxiety now has been increasing. She feels like she has a chemical imbalance and is tired of feeling so anxious.  In the interim, is thinking about catastrophic outcomes like an MI during sex making her have to use deep breathing in order to calm down.  We discussed meds again.  It is unlikely  She would not have side effects or discontinue another SSRI.  We discussed using an atypical like Zyprexa only at night with close f/u of any side effects. In addition since HTN is now elevated and out of tight control from her home BP readings averaging about 140 to 160s systolic with associated symptoms but no chest pain.   She is becoming more aware of when BP is elevated and often is associated with her daily anxiety surrounding her health, etc.  We discussed potentially using a Beta Blocker as well in hopes that will control her pulse and diminish the vasoactive/physiological changes she is experiencing as a manifestation such as her increased BP and pulse.    After discussing the side effections, options, and preferences regarding meds and her tolerance, she agreed to a plan.   The following portions of the patient's history were reviewed and updated as appropriate:  She  has a past medical history of Anxiety. She does not have any pertinent problems on file. She  has a past surgical history that includes Tubal ligation. Her family history includes Congestive Heart Failure in her mother. She  reports that she has never smoked. She has never used smokeless tobacco. She reports that she does not drink alcohol or use drugs.   Review of Systems   Constitutional: Positive for malaise/fatigue. Negative for diaphoresis and weight loss.  Respiratory: Positive for shortness of breath. Negative for wheezing.   Cardiovascular: Positive for chest pain and palpitations.  Gastrointestinal: Positive for abdominal pain and nausea.  Genitourinary: Positive for dysuria.  Neurological: Positive for headaches.  Psychiatric/Behavioral: The patient is nervous/anxious.    Review of Systems Constitutional: negative for chills, malaise, night sweats and weight loss Respiratory: negative for cough, dyspnea on exertion and wheezing Cardiovascular: negative for chest pain, chest pressure/discomfort and lower extremity edema Behavioral/Psych: negative except for anxiety Endocrine: negative for diabetic symptoms including polydipsia, polyphagia and polyuria and temperature intolerance         Current Outpatient Medications on File Prior to Visit  Medication Sig Dispense Refill  . amLODipine (NORVASC) 10 MG tablet Take by mouth.    . enalapril (VASOTEC) 5 MG tablet Take 1 tablet (5 mg total) by mouth daily. 30 tablet 3  . clonazePAM (KLONOPIN) 0.5 MG tablet Take 1 tablet (0.5 mg total) by mouth 2 (two) times daily as needed for anxiety. 30 tablet 1  . PARoxetine (PAXIL) 20 MG tablet Take 1 tablet (20 mg total) by mouth daily. 30 tablet 1   No current facility-administered medications on file prior to visit.   She has No Known Allergies..       GAD 7 : Generalized Anxiety Score 07/05/2019 06/06/2019 05/16/2019  Nervous, Anxious, on Edge 0 1 0  Control/stop worrying 0 0 0  Worry   too much - different things 0 3 0  Trouble relaxing 0 0 0  Restless 0 0 0  Easily annoyed or irritable 0 0 0  Afraid - awful might happen 0 0 0  Total GAD 7 Score 0 4 0  Anxiety Difficulty Not difficult at all Not difficult at all Not difficult at all   [facility] SCORE ONLY 07/05/2019 06/06/2019 05/16/2019  Score 0 0 0    Objective:    BP 135/83 (BP Location: Right Arm,  Patient Position: Sitting, Cuff Size: Normal)   Pulse 68   Temp 97.9 F (36.6 C) (Temporal)   Ht 5' 2" (1.575 m)   Wt 139 lb 9.6 oz (63.3 kg)   LMP 06/24/2019   SpO2 100%   BMI 25.53 kg/m   General:  alert  Affect/Behavior:  good insight, normal perception, normal reasoning and normal thought patterns mild depression and fatigue noted as she has been fasting for lipids      Assessment:    Anxiety Disorder - worsening, with panic attacks    Plan:   1. Essential hypertension   2. Acute left flank pain   3. Generalized anxiety disorder    Orders Placed This Encounter  Procedures  . Lipid panel  . CMP14+EGFR  . Ambulatory referral to Gastroenterology   Meds ordered this encounter  Medications  . metoprolol succinate (TOPROL-XL) 25 MG 24 hr tablet    Sig: Take 1 tablet (25 mg total) by mouth daily.    Dispense:  90 tablet    Refill:  3  . OLANZapine (ZYPREXA) 5 MG tablet    Sig: Take 1 tablet (5 mg total) by mouth at bedtime.    Dispense:  30 tablet    Refill:  2   Glyn Ade, NP

## 2019-07-14 ENCOUNTER — Telehealth: Payer: Self-pay | Admitting: Adult Health Nurse Practitioner

## 2019-07-14 NOTE — Telephone Encounter (Signed)
Can you please sign the 03/31 visit so I can send the referral?  Thank you

## 2019-07-18 ENCOUNTER — Encounter: Payer: Self-pay | Admitting: Gastroenterology

## 2019-07-25 DIAGNOSIS — R109 Unspecified abdominal pain: Secondary | ICD-10-CM | POA: Insufficient documentation

## 2019-08-04 ENCOUNTER — Other Ambulatory Visit: Payer: Self-pay

## 2019-08-04 ENCOUNTER — Ambulatory Visit (INDEPENDENT_AMBULATORY_CARE_PROVIDER_SITE_OTHER): Payer: 59 | Admitting: Adult Health Nurse Practitioner

## 2019-08-04 ENCOUNTER — Encounter: Payer: Self-pay | Admitting: Adult Health Nurse Practitioner

## 2019-08-04 VITALS — BP 148/97 | HR 71 | Temp 98.2°F | Ht 62.0 in | Wt 146.6 lb

## 2019-08-04 DIAGNOSIS — F411 Generalized anxiety disorder: Secondary | ICD-10-CM | POA: Diagnosis not present

## 2019-08-04 DIAGNOSIS — I10 Essential (primary) hypertension: Secondary | ICD-10-CM | POA: Diagnosis not present

## 2019-08-04 NOTE — Patient Instructions (Signed)
° ° ° °  If you have lab work done today you will be contacted with your lab results within the next 2 weeks.  If you have not heard from us then please contact us. The fastest way to get your results is to register for My Chart. ° ° °IF you received an x-ray today, you will receive an invoice from [facility]. Please contact [facility] at [phone] with questions or concerns regarding your invoice.  ° °IF you received labwork today, you will receive an invoice from [facility]. Please contact [facility] at [phone] with questions or concerns regarding your invoice.  ° °Our billing staff will not be able to assist you with questions regarding bills from these companies. ° °You will be contacted with the lab results as soon as they are available. The fastest way to get your results is to activate your My Chart account. Instructions are located on the last page of this paperwork. If you have not heard from us regarding the results in 2 weeks, please contact this office. °  ° ° ° °

## 2019-08-04 NOTE — Progress Notes (Signed)
     08/10/2019  Karna Christmas TYRIANA GREMS 07-23-1970 [id number]    Subjective:     Patient is a 49 y.o. female who presents for follow-up of blood pressure and anxiety. She states both are much more stable.  Feels anxiety is stable.  She has been working on Radiographer, therapeutic and centering, trying to Recent history: no chest pain on exertion, no dyspnea on exertion and no swelling of ankles. Patient's symptoms have been gradually improving. Medication side effects include: none.  The following portions of the patient's history were reviewed and updated as appropriate: allergies, current medications, past family history, past medical history, past social history, past surgical history and problem list.  Review of Systems Pertinent items noted in HPI and remainder of comprehensive ROS otherwise negative.    Objective:    Physical Exam BP (!) 148/97 (BP Location: Right Arm, Patient Position: Sitting, Cuff Size: Normal)   Pulse 71   Temp 98.2 F (36.8 C) (Temporal)   Ht 5\' 2"  (1.575 m)   Wt 146 lb 9.6 oz (66.5 kg)   LMP 06/24/2019   SpO2 100%   BMI 26.81 kg/m  General appearance: alert and no distress Lungs: clear to auscultation bilaterally Heart: regular rate and rhythm, S1, S2 normal, no murmur, click, rub or gallop Skin: Skin color, texture, turgor normal. No rashes or lesions Neurologic: Grossly normal   Lab Review  Recent labs from 3/31 reviewed again with patient.  Lipids looked great.  CMP stable.  No statin needed at this time.      Assessment:    1. Generalized anxiety disorder   2. Essential hypertension      Plan:   Continue current meds and treatment.  F/U in 3 months or sooner.    Glyn Ade, NP

## 2019-08-22 ENCOUNTER — Encounter: Payer: 59 | Admitting: Adult Health Nurse Practitioner

## 2019-08-24 ENCOUNTER — Encounter: Payer: Self-pay | Admitting: Gastroenterology

## 2019-08-24 ENCOUNTER — Ambulatory Visit (INDEPENDENT_AMBULATORY_CARE_PROVIDER_SITE_OTHER): Payer: 59 | Admitting: Gastroenterology

## 2019-08-24 VITALS — BP 138/90 | HR 68 | Ht 62.0 in | Wt 148.2 lb

## 2019-08-24 DIAGNOSIS — R1032 Left lower quadrant pain: Secondary | ICD-10-CM

## 2019-08-24 DIAGNOSIS — Z1211 Encounter for screening for malignant neoplasm of colon: Secondary | ICD-10-CM

## 2019-08-24 MED ORDER — SUTAB 1479-225-188 MG PO TABS: 1.0000 | ORAL_TABLET | Freq: Once | ORAL | 0 refills | Status: AC

## 2019-08-24 NOTE — Progress Notes (Signed)
Referring Provider: Wendall Mola, NP Primary Care Physician:  Wendall Mola, NP  Reason for Consultation:  Abdominal pain   IMPRESSION:  Abdominal pain, now improved Chronic constipation with ? Associated pelvic floor dyssfunction    - normal TSH and calcium    - improved with dietary changes Need for colon cancer screening No known family history of colon cancer or polyps  Abdominal pain: May be related to constipation given her improvement with successful treatment of constipation.   Chronic constipation: Discussed lifestyle manifestations. Discussed anorectal manometry + pelvic floor PT/biofeedback. She may call to schedule at any time.   Need for colon cancer screening: Discussed options. She prefers colonoscopy after doing her own research about Cologard prior to this appointment.   PLAN: Continue high fiber diet, drink at least 64 ounces of water, continue to use flax seed Colonoscopy  Please see the "Patient Instructions" section for addition details about the plan.  The nature of the procedure, as well as the risks, benefits, and alternatives were carefully and thoroughly reviewed with the patient. Ample time for discussion and questions allowed. The patient understood, was satisfied, and agreed to proceed.  HPI: Andrea Palmer is a 49 y.o. female referred by NP [facility]. She has hypertension and anxiety.  She is a Theme park manager.   Developed internal left lower quadrant sore/abdominal pain last year that improved with defecation. No change in bowel habits. No nausea. Weight stable. Has a lifetime history of constipation and chronic bloating. Started using flax seed gel oils with complete relief of the pain. Will use a Dulocolax if no BM after 3 days. Intermittent sense of incomplete evacuation. No manual assistance with defecation. No blood or mucous.   Decided to keep this appointment even though her symptoms have resolved because she knew it was time for a  colonoscopy.    Perimenopausal and she was evaluated by OB/GYN to be sure this wasn't related. Had a reassuring pelvic ultrasound.  No prior endoscopic evaluation. No prior abdominal imaging.   Labs 07/05/2019 showed a normal comprehensive metabolic panel.  She had a normal TSH 2/21.  She had a normal CBC in 2020 with a hemoglobin of 13.5, MCV 98.6, RDW 13.4, platelets 248.Marland Kitchen  Daughter was diagnosed with Crohn's disease previously followed by Dr. Olevia Perches. She is no longer symptomatic and does not require medications. No known family history of colon cancer or polyps. No family history of uterine/endometrial cancer, pancreatic cancer or gastric/stomach cancer.   Past Medical History:  Diagnosis Date  . Anxiety     Past Surgical History:  Procedure Laterality Date  . TUBAL LIGATION      Current Outpatient Medications  Medication Sig Dispense Refill  . amLODipine (NORVASC) 10 MG tablet Take by mouth.    . clonazePAM (KLONOPIN) 0.5 MG tablet Take 1 tablet (0.5 mg total) by mouth 2 (two) times daily as needed for anxiety. 30 tablet 1  . enalapril (VASOTEC) 5 MG tablet Take 1 tablet (5 mg total) by mouth daily. 30 tablet 3  . metoprolol succinate (TOPROL-XL) 25 MG 24 hr tablet Take 1 tablet (25 mg total) by mouth daily. 90 tablet 3  . OLANZapine (ZYPREXA) 5 MG tablet Take 1 tablet (5 mg total) by mouth at bedtime. 30 tablet 2  . PARoxetine (PAXIL) 20 MG tablet Take 1 tablet (20 mg total) by mouth daily. 30 tablet 1   No current facility-administered medications for this visit.    Allergies as of 08/24/2019  . (No  Known Allergies)    Family History  Problem Relation Age of Onset  . Congestive Heart Failure Mother     Social History   Socioeconomic History  . Marital status: Single    Spouse name: Not on file  . Number of children: 2  . Years of education: 59  . Highest education level: Not on file  Occupational History  . Occupation: Hair stylist   Tobacco Use  . Smoking  status: Never Smoker  . Smokeless tobacco: Never Used  Substance and Sexual Activity  . Alcohol use: No    Alcohol/week: 0.0 standard drinks  . Drug use: No  . Sexual activity: Yes    Partners: Male    Birth control/protection: None  Other Topics Concern  . Not on file  Social History Narrative   Born and raised in [place]. Currently resides in an apartment with her son. Has a rabbit. Fun: watch tv (law and order; criminal minds); Denies any religious beliefs that would effect health care.   Social Determinants of Health   Financial Resource Strain:   . Difficulty of Paying Living Expenses:   Food Insecurity:   . Worried About Charity fundraiser in the Last Year:   . Arboriculturist in the Last Year:   Transportation Needs:   . Film/video editor (Medical):   Marland Kitchen Lack of Transportation (Non-Medical):   Physical Activity:   . Days of Exercise per Week:   . Minutes of Exercise per Session:   Stress:   . Feeling of Stress :   Social Connections:   . Frequency of Communication with Friends and Family:   . Frequency of Social Gatherings with Friends and Family:   . Attends Religious Services:   . Active Member of Clubs or Organizations:   . Attends Archivist Meetings:   Marland Kitchen Marital Status:   Intimate Partner Violence:   . Fear of Current or Ex-Partner:   . Emotionally Abused:   Marland Kitchen Physically Abused:   . Sexually Abused:     Review of Systems: 12 system ROS is negative except as noted above.   Physical Exam: General:   Alert,  well-nourished, pleasant and cooperative in NAD Head:  Normocephalic and atraumatic. Eyes:  Sclera clear, no icterus.   Conjunctiva pink. Ears:  Normal auditory acuity. Nose:  No deformity, discharge,  or lesions. Mouth:  No deformity or lesions.   Neck:  Supple; no masses or thyromegaly. Lungs:  Clear throughout to auscultation.   No wheezes. Heart:  Regular rate and rhythm; no murmurs. Abdomen:  Soft,nontender, nondistended,  normal bowel sounds, no rebound or guarding. No hepatosplenomegaly.   Rectal:  Deferred  Msk:  Symmetrical. No boney deformities LAD: No inguinal or umbilical LAD Extremities:  No clubbing or edema. Neurologic:  Alert and  oriented x4;  grossly nonfocal Skin:  Intact without significant lesions or rashes. Psych:  Alert and cooperative. Normal mood and affect.    Sady Monaco L. Tarri Glenn, MD, MPH 08/24/2019, 8:58 AM

## 2019-08-24 NOTE — Patient Instructions (Addendum)
If you are age 50 or older, your body mass index should be between 23-30. Your Body mass index is 27.12 kg/m. If this is out of the aforementioned range listed, please consider follow up with your Primary Care Provider.  If you are age 54 or younger, your body mass index should be between 19-25. Your Body mass index is 27.12 kg/m. If this is out of the aformentioned range listed, please consider follow up with your Primary Care Provider.   Tips for colonoscopy:  - Stay well hydrated for 3-4 days prior to the exam. This reduces nausea and dehydration.  - To prevent skin/hemorrhoid irritation - prior to wiping, put A&Dointment or vaseline on the toilet paper. - Keep a towel or pad on the bed.  - Drink  64oz of clear liquids in the morning of prep day (prior to starting the prep) to be sure that there is enough fluid to flush the colon and stay hydrated!!!! This is in addition to the fluids required for preparation. - Use of a flavored hard candy, such as grape Anise Salvo, can counteract some of the flavor of the prep and may prevent some nausea  Due to recent changes in healthcare laws, you may see the results of your imaging and laboratory studies on MyChart before your provider has had a chance to review them.  We understand that in some cases there may be results that are confusing or concerning to you. Not all laboratory results come back in the same time frame and the provider may be waiting for multiple results in order to interpret others.  Please give [place] 48 hours in order for your provider to thoroughly review all the results before contacting the office for clarification of your results.   We have sent the following medications to your pharmacy for you to pick up at your convenience:  Suprep

## 2019-08-31 ENCOUNTER — Other Ambulatory Visit: Payer: Self-pay

## 2019-08-31 ENCOUNTER — Encounter: Payer: Self-pay | Admitting: Gastroenterology

## 2019-08-31 ENCOUNTER — Ambulatory Visit (AMBULATORY_SURGERY_CENTER): Payer: 59 | Admitting: Gastroenterology

## 2019-08-31 VITALS — BP 118/88 | HR 63 | Temp 96.8°F | Resp 12 | Ht 62.0 in | Wt 148.0 lb

## 2019-08-31 DIAGNOSIS — K6289 Other specified diseases of anus and rectum: Secondary | ICD-10-CM

## 2019-08-31 DIAGNOSIS — R1032 Left lower quadrant pain: Secondary | ICD-10-CM

## 2019-08-31 DIAGNOSIS — Z1211 Encounter for screening for malignant neoplasm of colon: Secondary | ICD-10-CM | POA: Diagnosis present

## 2019-08-31 DIAGNOSIS — D122 Benign neoplasm of ascending colon: Secondary | ICD-10-CM

## 2019-08-31 DIAGNOSIS — K122 Cellulitis and abscess of mouth: Secondary | ICD-10-CM | POA: Diagnosis not present

## 2019-08-31 DIAGNOSIS — D3A026 Benign carcinoid tumor of the rectum: Secondary | ICD-10-CM

## 2019-08-31 MED ORDER — SODIUM CHLORIDE 0.9 % IV SOLN: 500.0000 mL | Freq: Once | INTRAVENOUS | Status: DC

## 2019-08-31 NOTE — Progress Notes (Signed)
Called to room to assist during endoscopic procedure.  Patient ID and intended procedure confirmed with present staff. Received instructions for my participation in the procedure from the performing physician.  

## 2019-08-31 NOTE — Op Note (Signed)
[facility] Patient Name: Jason Herbst Procedure Date: 08/31/2019 9:28 AM MRN: [id number] Endoscopist: Thornton Park MD, MD Age: 49 Referring MD:  Date of Birth: 01/20/1971 Gender: Female Account #: [id number] Procedure:                Colonoscopy Indications:              Screening for colorectal malignant neoplasm, This                            is the patient's first colonoscopy                           No known family history of colon cancer or polyps Medicines:                Monitored Anesthesia Care Procedure:                Pre-Anesthesia Assessment:                           - Prior to the procedure, a History and Physical                            was performed, and patient medications and                            allergies were reviewed. The patient's tolerance of                            previous anesthesia was also reviewed. The risks                            and benefits of the procedure and the sedation                            options and risks were discussed with the patient.                            All questions were answered, and informed consent                            was obtained. Prior Anticoagulants: The patient has                            taken no previous anticoagulant or antiplatelet                            agents. ASA Grade Assessment: II - A patient with                            mild systemic disease. After reviewing the risks                            and benefits, the patient was deemed in  satisfactory condition to undergo the procedure.                           After obtaining informed consent, the colonoscope                            was passed under direct vision. Throughout the                            procedure, the patient's blood pressure, pulse, and                            oxygen saturations were monitored continuously. The                            Colonoscope was  introduced through the anus and                            advanced to the 4 cm into the ileum. A second                            forward view of the right colon was performed. The                            colonoscopy was performed without difficulty. The                            patient tolerated the procedure well. The quality                            of the bowel preparation was good. The terminal                            ileum, ileocecal valve, appendiceal orifice, and                            rectum were photographed. Scope In: 9:34:22 AM Scope Out: 9:54:20 AM Scope Withdrawal Time: 0 hours 17 minutes 55 seconds  Total Procedure Duration: 0 hours 19 minutes 58 seconds  Findings:                 The perianal and digital rectal examinations were                            normal.                           A 1 mm polyp was found in the ascending colon. The                            polyp was sessile. The polyp was removed with a                            cold biopsy forceps. Resection and retrieval were  complete. Estimated blood loss: none.                           One 4 mm submucosal nodule was found in the rectum                            approximately 8 cm from the anal verge. Tunneled                            biopsies were taken with a cold forceps for                            histology. Estimated blood loss was minimal.                           Non-bleeding internal hemorrhoids were found.                           The exam was otherwise without abnormality on                            direct and retroflexion views.                           The exam was otherwise without abnormality. Complications:            No immediate complications. Estimated blood loss:                            Minimal. Estimated Blood Loss:     Estimated blood loss was minimal. Impression:               - One 1 mm polyp in the ascending colon, removed                             with a cold biopsy forceps. Resected and retrieved.                           - Submucosal nodule in the rectum. Biopsied.                           - Non-bleeding internal hemorrhoids.                           - The examination was otherwise normal on direct                            and retroflexion views.                           - The examination was otherwise normal. Recommendation:           - Patient has a contact number available for                            emergencies. The signs and symptoms  of potential                            delayed complications were discussed with the                            patient. Return to normal activities tomorrow.                            Written discharge instructions were provided to the                            patient.                           - Resume previous diet.                           - Continue present medications.                           - Await pathology results.                           - Repeat colonoscopy date to be determined after                            pending pathology results are reviewed for                            surveillance.                           - Emerging evidence supports eating a diet of                            fruits, vegetables, grains, calcium, and yogurt                            while reducing red meat and alcohol may reduce the                            risk of colon cancer.                           - Thank you for allowing me to be involved in your                            colon cancer prevention. Thornton Park MD, MD 08/31/2019 10:01:34 AM This report has been signed electronically.

## 2019-08-31 NOTE — Patient Instructions (Signed)
Handouts given:  Polyps, hemorrhoids Resume previous diet Continue current medications Await pathology   YOU HAD AN ENDOSCOPIC PROCEDURE TODAY AT [facility]:   Refer to the procedure report that was given to you for any specific questions about what was found during the examination.  If the procedure report does not answer your questions, please call your gastroenterologist to clarify.  If you requested that your care partner not be given the details of your procedure findings, then the procedure report has been included in a sealed envelope for you to review at your convenience later.  YOU SHOULD EXPECT: Some feelings of bloating in the abdomen. Passage of more gas than usual.  Walking can help get rid of the air that was put into your GI tract during the procedure and reduce the bloating. If you had a lower endoscopy (such as a colonoscopy or flexible sigmoidoscopy) you may notice spotting of blood in your stool or on the toilet paper. If you underwent a bowel prep for your procedure, you may not have a normal bowel movement for a few days.  Please Note:  You might notice some irritation and congestion in your nose or some drainage.  This is from the oxygen used during your procedure.  There is no need for concern and it should clear up in a day or so.  SYMPTOMS TO REPORT IMMEDIATELY:   Following lower endoscopy (colonoscopy or flexible sigmoidoscopy):  Excessive amounts of blood in the stool  Significant tenderness or worsening of abdominal pains  Swelling of the abdomen that is new, acute  Fever of 100F or higher   For urgent or emergent issues, a gastroenterologist can be reached at any hour by calling [phone]. Do not use MyChart messaging for urgent concerns.    DIET:  We do recommend a small meal at first, but then you may proceed to your regular diet.  Drink plenty of fluids but you should avoid alcoholic beverages for 24 hours.  ACTIVITY:  You should plan  to take it easy for the rest of today and you should NOT DRIVE or use heavy machinery until tomorrow (because of the sedation medicines used during the test).    FOLLOW UP: Our staff will call the number listed on your records 48-72 hours following your procedure to check on you and address any questions or concerns that you may have regarding the information given to you following your procedure. If we do not reach you, we will leave a message.  We will attempt to reach you two times.  During this call, we will ask if you have developed any symptoms of COVID 19. If you develop any symptoms (ie: fever, flu-like symptoms, shortness of breath, cough etc.) before then, please call [phone].  If you test positive for Covid 19 in the 2 weeks post procedure, please call and report this information to [place].    If any biopsies were taken you will be contacted by phone or by letter within the next 1-3 weeks.  Please call us at [phone] if you have not heard about the biopsies in 3 weeks.    SIGNATURES/CONFIDENTIALITY: You and/or your care partner have signed paperwork which will be entered into your electronic medical record.  These signatures attest to the fact that that the information above on your After Visit Summary has been reviewed and is understood.  Full responsibility of the confidentiality of this discharge information lies with you and/or your care-partner.

## 2019-08-31 NOTE — Progress Notes (Signed)
To PACU, VSS. Report to [email or web address] 

## 2019-08-31 NOTE — Progress Notes (Signed)
VS taken by CW  Pt's states no medical or surgical changes since previsit or office visit.

## 2019-09-05 ENCOUNTER — Telehealth: Payer: Self-pay

## 2019-09-05 ENCOUNTER — Telehealth: Payer: Self-pay | Admitting: Gastroenterology

## 2019-09-05 NOTE — Telephone Encounter (Signed)
Dr. Lyndon Code office

## 2019-09-05 NOTE — Telephone Encounter (Signed)
Dr. 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 Dr. Lyndon Code states that he will close out and results should be in epic in about 20 minutes.

## 2019-09-05 NOTE — Telephone Encounter (Signed)
  Follow up Call-  Call back number 08/31/2019  Post procedure Call Back phone  # [phone]  Permission to leave phone message Yes  Some recent data might be hidden     Patient questions:  Do you have a fever, pain , or abdominal swelling? No. Pain Score  0 *  Have you tolerated food without any problems? Yes.    Have you been able to return to your normal activities? Yes.    Do you have any questions about your discharge instructions: Diet   No. Medications  No. Follow up visit  No.  Do you have questions or concerns about your Care? No.  Actions: * If pain score is 4 or above: No action needed, pain <4.  1. Have you developed a fever since your procedure? no  2.   Have you had an respiratory symptoms (SOB or cough) since your procedure? no  3.   Have you tested positive for COVID 19 since your procedure no  4.   Have you had any family members/close contacts diagnosed with the COVID 19 since your procedure?  no   If yes to any of these questions please route to Joylene John, RN and Erenest Rasher, RN

## 2019-09-05 NOTE — Telephone Encounter (Signed)
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 Thank you.

## 2019-09-06 NOTE — Telephone Encounter (Signed)
Thank you. Patty, I will let you know after I speak with the patient.

## 2019-09-06 NOTE — Telephone Encounter (Signed)
She would like to have the EUS performed ASAP.

## 2019-09-06 NOTE — Telephone Encounter (Signed)
KLB, This is something that we will take care of. Would get a Chromogranin A level in the coming weeks. We should be able to treat this endoscopically. Patty, please work on arranging a Rectal EUS with EMR with me in the next couple of months, after KLB has told the patient about the results. Thank you. GM

## 2019-09-06 NOTE — Telephone Encounter (Signed)
Results and recommendations discussed with the patient by phone. Please arrange for lab and EUS as outlined below. Thank you.

## 2019-09-07 ENCOUNTER — Other Ambulatory Visit: Payer: Self-pay

## 2019-09-07 DIAGNOSIS — K6289 Other specified diseases of anus and rectum: Secondary | ICD-10-CM

## 2019-09-07 NOTE — Telephone Encounter (Signed)
The pt has been advised that appt has been made for 10/23/19 at 11 am [facility] with Dr Rush Landmark for Lower EUS EMR and COVID test on 10/19/19 at 1005 am.  The pt has confirmed that she would like instructions sent to [facility] Chart

## 2019-09-26 ENCOUNTER — Other Ambulatory Visit: Payer: Self-pay | Admitting: Family Medicine

## 2019-09-26 MED ORDER — ENALAPRIL MALEATE 5 MG PO TABS: 5.0000 mg | ORAL_TABLET | Freq: Every day | ORAL | 3 refills | Status: DC

## 2019-09-26 NOTE — Telephone Encounter (Signed)
Medication: enalapril (VASOTEC) 5 MG tablet (Patient stated that pharmacy is waiting on pre approval from PCP to fill medication today.)  Has the patient contacted their pharmacy? yes (Agent: If no, request that the patient contact the pharmacy for the refill.) (Agent: If yes, when and what did the pharmacy advise?)contact pcp  Preferred Pharmacy (with phone number or street name): [facility] [phone] - [place], [address] AT [address]  Phone:  [phone] Fax:  [phone]

## 2019-09-26 NOTE — Telephone Encounter (Signed)
Requested medication (s) are due for refill today: -yes  Requested medication (s) are on the active medication list: no prescription expired 08/31/19  Last refill:  06/06/19  Future visit scheduled: yes  Notes to clinic:  This med has expired and cannot be refilled by [facility] - please review   Requested Prescriptions  Pending Prescriptions Disp Refills   enalapril (VASOTEC) 5 MG tablet 30 tablet 3    Sig: Take 1 tablet (5 mg total) by mouth daily.      Cardiovascular:  ACE Inhibitors Passed - 09/26/2019 10:48 AM      Passed - Cr in normal range and within 180 days    Creatinine, Ser  Date Value Ref Range Status  07/05/2019 0.72 0.57 - 1.00 mg/dL Final          Passed - K in normal range and within 180 days    Potassium  Date Value Ref Range Status  07/05/2019 4.0 3.5 - 5.2 mmol/L Final          Passed - Patient is not pregnant      Passed - Last BP in normal range    BP Readings from Last 1 Encounters:  08/31/19 118/88          Passed - Valid encounter within last 6 months    Recent Outpatient Visits           1 month ago Generalized anxiety disorder   Primary Care at [facility], Lorelee Market, NP   2 months ago Essential hypertension   Primary Care at [facility], Lorelee Market, NP   3 months ago Essential hypertension   Primary Care at [facility], Lorelee Market, NP   4 months ago Essential hypertension   Primary Care at [facility], Lorelee Market, NP   2 years ago Dysfunctional uterine bleeding   Primary Care at [facility], Laurey Arrow, MD       Future Appointments             In 1 week Rutherford Guys, MD Primary Care at [place], [facility]

## 2019-10-02 ENCOUNTER — Other Ambulatory Visit: Payer: 59

## 2019-10-02 DIAGNOSIS — K6289 Other specified diseases of anus and rectum: Secondary | ICD-10-CM

## 2019-10-03 ENCOUNTER — Other Ambulatory Visit: Payer: Self-pay

## 2019-10-03 ENCOUNTER — Encounter: Payer: Self-pay | Admitting: Family Medicine

## 2019-10-03 ENCOUNTER — Ambulatory Visit (INDEPENDENT_AMBULATORY_CARE_PROVIDER_SITE_OTHER): Payer: 59 | Admitting: Family Medicine

## 2019-10-03 VITALS — BP 151/89 | HR 68 | Temp 97.9°F | Ht 62.0 in | Wt 149.0 lb

## 2019-10-03 DIAGNOSIS — I10 Essential (primary) hypertension: Secondary | ICD-10-CM

## 2019-10-03 DIAGNOSIS — F411 Generalized anxiety disorder: Secondary | ICD-10-CM | POA: Diagnosis not present

## 2019-10-03 DIAGNOSIS — D3A026 Benign carcinoid tumor of the rectum: Secondary | ICD-10-CM | POA: Insufficient documentation

## 2019-10-03 DIAGNOSIS — Z0001 Encounter for general adult medical examination with abnormal findings: Secondary | ICD-10-CM

## 2019-10-03 DIAGNOSIS — Z86010 Personal history of colonic polyps: Secondary | ICD-10-CM

## 2019-10-03 DIAGNOSIS — Z0000 Encounter for general adult medical examination without abnormal findings: Secondary | ICD-10-CM

## 2019-10-03 LAB — CHROMOGRANIN A: Chromogranin A (ng/mL): 66.9 ng/mL (ref 0.0–101.8)

## 2019-10-03 MED ORDER — OLANZAPINE 5 MG PO TABS: 5.0000 mg | ORAL_TABLET | Freq: Every day | ORAL | 1 refills | Status: DC

## 2019-10-03 MED ORDER — METOPROLOL SUCCINATE ER 25 MG PO TB24: 25.0000 mg | ORAL_TABLET | Freq: Every day | ORAL | 3 refills | Status: DC

## 2019-10-03 MED ORDER — ENALAPRIL MALEATE 10 MG PO TABS: 10.0000 mg | ORAL_TABLET | Freq: Every day | ORAL | 1 refills | Status: DC

## 2019-10-03 NOTE — Patient Instructions (Addendum)
   If you have lab work done today you will be contacted with your lab results within the next 2 weeks.  If you have not heard from us then please contact us. The fastest way to get your results is to register for My Chart.   IF you received an x-ray today, you will receive an invoice from [facility]. Please contact [facility] at [phone] with questions or concerns regarding your invoice.   IF you received labwork today, you will receive an invoice from [facility]. Please contact [facility] at [phone] with questions or concerns regarding your invoice.   Our billing staff will not be able to assist you with questions regarding bills from these companies.  You will be contacted with the lab results as soon as they are available. The fastest way to get your results is to activate your My Chart account. Instructions are located on the last page of this paperwork. If you have not heard from us regarding the results in 2 weeks, please contact this office.     Preventive Care 40-64 Years Old, Female Preventive care refers to visits with your health care provider and lifestyle choices that can promote health and wellness. This includes:  A yearly physical exam. This may also be called an annual well check.  Regular dental visits and eye exams.  Immunizations.  Screening for certain conditions.  Healthy lifestyle choices, such as eating a healthy diet, getting regular exercise, not using drugs or products that contain nicotine and tobacco, and limiting alcohol use. What can I expect for my preventive care visit? Physical exam Your health care provider will check your:  Height and weight. This may be used to calculate body mass index (BMI), which tells if you are at a healthy weight.  Heart rate and blood pressure.  Skin for abnormal spots. Counseling Your health care provider may ask you questions about your:  Alcohol, tobacco, and drug use.  Emotional  well-being.  Home and relationship well-being.  Sexual activity.  Eating habits.  Work and work environment.  Method of birth control.  Menstrual cycle.  Pregnancy history. What immunizations do I need?  Influenza (flu) vaccine  This is recommended every year. Tetanus, diphtheria, and pertussis (Tdap) vaccine  You may need a Td booster every 10 years. Varicella (chickenpox) vaccine  You may need this if you have not been vaccinated. Zoster (shingles) vaccine  You may need this after age 60. Measles, mumps, and rubella (MMR) vaccine  You may need at least one dose of MMR if you were born in 1957 or later. You may also need a second dose. Pneumococcal conjugate (PCV13) vaccine  You may need this if you have certain conditions and were not previously vaccinated. Pneumococcal polysaccharide (PPSV23) vaccine  You may need one or two doses if you smoke cigarettes or if you have certain conditions. Meningococcal conjugate (MenACWY) vaccine  You may need this if you have certain conditions. Hepatitis A vaccine  You may need this if you have certain conditions or if you travel or work in places where you may be exposed to hepatitis A. Hepatitis B vaccine  You may need this if you have certain conditions or if you travel or work in places where you may be exposed to hepatitis B. Haemophilus influenzae type b (Hib) vaccine  You may need this if you have certain conditions. Human papillomavirus (HPV) vaccine  If recommended by your health care provider, you may need three doses over 6 months.   You may receive vaccines as individual doses or as more than one vaccine together in one shot (combination vaccines). Talk with your health care provider about the risks and benefits of combination vaccines. What tests do I need? Blood tests  Lipid and cholesterol levels. These may be checked every 5 years, or more frequently if you are over 50 years old.  Hepatitis C  test.  Hepatitis B test. Screening  Lung cancer screening. You may have this screening every year starting at age 55 if you have a 30-pack-year history of smoking and currently smoke or have quit within the past 15 years.  Colorectal cancer screening. All adults should have this screening starting at age 50 and continuing until age 75. Your health care provider may recommend screening at age 45 if you are at increased risk. You will have tests every 1-10 years, depending on your results and the type of screening test.  Diabetes screening. This is done by checking your blood sugar (glucose) after you have not eaten for a while (fasting). You may have this done every 1-3 years.  Mammogram. This may be done every 1-2 years. Talk with your health care provider about when you should start having regular mammograms. This may depend on whether you have a family history of breast cancer.  BRCA-related cancer screening. This may be done if you have a family history of breast, ovarian, tubal, or peritoneal cancers.  Pelvic exam and Pap test. This may be done every 3 years starting at age 21. Starting at age 30, this may be done every 5 years if you have a Pap test in combination with an HPV test. Other tests  Sexually transmitted disease (STD) testing.  Bone density scan. This is done to screen for osteoporosis. You may have this scan if you are at high risk for osteoporosis. Follow these instructions at home: Eating and drinking  Eat a diet that includes fresh fruits and vegetables, whole grains, lean protein, and low-fat dairy.  Take vitamin and mineral supplements as recommended by your health care provider.  Do not drink alcohol if: ? Your health care provider tells you not to drink. ? You are pregnant, may be pregnant, or are planning to become pregnant.  If you drink alcohol: ? Limit how much you have to 0-1 drink a day. ? Be aware of how much alcohol is in your drink. In the [place] one  drink equals one 12 oz bottle of beer (355 mL), one 5 oz glass of wine (148 mL), or one 1 oz glass of hard liquor (44 mL). Lifestyle  Take daily care of your teeth and gums.  Stay active. Exercise for at least 30 minutes on 5 or more days each week.  Do not use any products that contain nicotine or tobacco, such as cigarettes, e-cigarettes, and chewing tobacco. If you need help quitting, ask your health care provider.  If you are sexually active, practice safe sex. Use a condom or other form of birth control (contraception) in order to prevent pregnancy and STIs (sexually transmitted infections).  If told by your health care provider, take low-dose aspirin daily starting at age 50. What's next?  Visit your health care provider once a year for a well check visit.  Ask your health care provider how often you should have your eyes and teeth checked.  Stay up to date on all vaccines. This information is not intended to replace advice given to you by your health care provider. Make sure   you discuss any questions you have with your health care provider. Document Revised: 12/02/2017 Document Reviewed: 12/02/2017 Elsevier Patient Education  2020 [employer]  

## 2019-10-03 NOTE — Progress Notes (Signed)
6/29/202110:50 AM  Andrea Palmer September 07, 1970, 49 y.o., female [id number]  Chief Complaint  Patient presents with  . Annual Exam    no pap / hypertension     HPI:   Patient is a 49 y.o. female with past medical history significant for HTN, GAD, colonic polyps, rectal carcinoid who presents today for CPE  G&Ps: 2002 Pap: last pap nov 2020, [facility], Dr Earleen Newport STD: declines BC : BTL Menses: irregular, having hot flashes Mammogram: mammogram nov 2020, [facility], Dr Earleen Newport [facility] breast/ovarian cancer: none Colon cancer screening: colonoscopy in may 2021, + tubular adenoma and rectal carcinoid, scheduled for further eval Exercise/diet: does yoga, walks almost daily, small portions Eye: has not seen, no vision concerns Dentist: within 6 months  Most Recent Immunizations  Administered Date(s) Administered  . PFIZER SARS-COV-2 Vaccination 08/09/2019  . Tdap 04/29/2017    Hearing Screening   125Hz  250Hz  500Hz  1000Hz  2000Hz  3000Hz  4000Hz  6000Hz  8000Hz   Right ear:           Left ear:             Visual Acuity Screening   Right eye Left eye Both eyes  Without correction: 2020 2020 2015  With correction:       Depression screen [facility] 2/9 10/03/2019 08/04/2019 07/05/2019  Decreased Interest 0 0 0  Down, Depressed, Hopeless 0 0 0  PHQ - 2 Score 0 0 0    Fall Risk  10/03/2019 08/04/2019 07/05/2019 06/06/2019 05/16/2019  Falls in the past year? 0 0 0 0 0  Number falls in past yr: 0 0 0 0 -  Injury with Fall? 0 0 0 0 0  Follow up Falls evaluation completed Falls evaluation completed Falls evaluation completed Falls evaluation completed Falls evaluation completed     No Known Allergies  Prior to Admission medications   Medication Sig Start Date End Date Taking? Authorizing Provider  enalapril (VASOTEC) 5 MG tablet Take 1 tablet (5 mg total) by mouth daily. 09/26/19 10/26/19 Yes Rutherford Guys, MD  metoprolol succinate (TOPROL-XL) 25 MG 24 hr tablet Take 1 tablet (25 mg total) by  mouth daily. 07/05/19  Yes Wendall Mola, NP  OLANZapine (ZYPREXA) 5 MG tablet Take 1 tablet (5 mg total) by mouth at bedtime. 07/05/19 10/03/19 Yes Wendall Mola, NP    Past Medical History:  Diagnosis Date  . Anxiety   . HTN (hypertension)     Past Surgical History:  Procedure Laterality Date  . TUBAL LIGATION      Social History   Tobacco Use  . Smoking status: Never Smoker  . Smokeless tobacco: Never Used  Substance Use Topics  . Alcohol use: No    Alcohol/week: 0.0 standard drinks    Family History  Problem Relation Age of Onset  . Congestive Heart Failure Mother   . Hypertension Sister   . Colon cancer Neg Hx   . Esophageal cancer Neg Hx   . Rectal cancer Neg Hx   . Stomach cancer Neg Hx     Review of Systems  Constitutional: Negative for chills and fever.  Respiratory: Negative for cough and shortness of breath.   Cardiovascular: Negative for chest pain, palpitations and leg swelling.  Gastrointestinal: Negative for abdominal pain, nausea and vomiting.  All other systems reviewed and are negative.    OBJECTIVE:  Today's Vitals   10/03/19 1049  BP: (!) 151/89  Pulse: 68  Temp: 97.9 F (36.6 C)  SpO2: 99%  Weight:  149 lb (67.6 kg)  Height: 5\' 2"  (1.575 m)  zz Body mass index is 27.25 kg/m.   BP Readings from Last 3 Encounters:  10/03/19 (!) 151/89  08/31/19 118/88  08/24/19 138/90     Physical Exam Vitals and nursing note reviewed.  Constitutional:      Appearance: She is well-developed.  HENT:     Head: Normocephalic and atraumatic.     Right Ear: Hearing, tympanic membrane, ear canal and external ear normal.     Left Ear: Hearing, tympanic membrane, ear canal and external ear normal.     Mouth/Throat:     Mouth: Mucous membranes are moist.     Pharynx: No oropharyngeal exudate or posterior oropharyngeal erythema.  Eyes:     Extraocular Movements: Extraocular movements intact.     Conjunctiva/sclera: Conjunctivae normal.      Pupils: Pupils are equal, round, and reactive to light.  Neck:     Thyroid: No thyromegaly.  Cardiovascular:     Rate and Rhythm: Normal rate and regular rhythm.     Heart sounds: Normal heart sounds. No murmur heard.  No friction rub. No gallop.   Pulmonary:     Effort: Pulmonary effort is normal.     Breath sounds: Normal breath sounds. No wheezing, rhonchi or rales.  Abdominal:     General: Bowel sounds are normal. There is no distension.     Palpations: Abdomen is soft. There is no hepatomegaly, splenomegaly or mass.     Tenderness: There is no abdominal tenderness.  Musculoskeletal:        General: Normal range of motion.     Cervical back: Neck supple.     Right lower leg: No edema.     Left lower leg: No edema.  Lymphadenopathy:     Cervical: No cervical adenopathy.  Skin:    General: Skin is warm and dry.  Neurological:     Mental Status: She is alert and oriented to person, place, and time.     Cranial Nerves: No cranial nerve deficit.     Gait: Gait normal.     Deep Tendon Reflexes: Reflexes are normal and symmetric.  Psychiatric:        Mood and Affect: Mood normal.        Behavior: Behavior normal.      Lab Results  Component Value Date   CREATININE 0.72 07/05/2019   BUN 12 07/05/2019   NA 139 07/05/2019   K 4.0 07/05/2019   CL 104 07/05/2019   CO2 24 07/05/2019   Lab Results  Component Value Date   CHOL 206 (H) 07/05/2019   HDL 78 07/05/2019   LDLCALC 116 (H) 07/05/2019   TRIG 67 07/05/2019   CHOLHDL 2.6 07/05/2019   Lab Results  Component Value Date   TSH 1.600 05/16/2019    No results found for this or any previous visit (from the past 24 hour(s)).  No results found.   ASSESSMENT and PLAN  1. Annual physical exam No concerns per history or exam. Routine HCM labs reviewed. HCM reviewed/discussed. Anticipatory guidance regarding healthy weight, lifestyle and choices given.   2. Essential hypertension Not at goal. Increase enlapril to  10mg  daily. Cont metoprolol.  3. Generalized anxiety disorder Stable. Cont with zyprexa. Patient failed several other agents.   4. Carcinoid tumor of rectum, unspecified whether malignant Managed by GI  5. Hx of adenomatous colonic polyps Repeat colonoscopy in 3 years  Other orders - enalapril (VASOTEC) 10 MG tablet;  Take 1 tablet (10 mg total) by mouth daily. - metoprolol succinate (TOPROL-XL) 25 MG 24 hr tablet; Take 1 tablet (25 mg total) by mouth daily. - OLANZapine (ZYPREXA) 5 MG tablet; Take 1 tablet (5 mg total) by mouth at bedtime.  Return in about 4 weeks (around 10/31/2019) for HTN.    Rutherford Guys, MD [facility] [place], [place] [address] Ph.  [phone] Fax [phone]

## 2019-10-19 ENCOUNTER — Other Ambulatory Visit (HOSPITAL_COMMUNITY)
Admission: RE | Admit: 2019-10-19 | Discharge: 2019-10-19 | Disposition: A | Discharge status: Home / Self Care | Payer: 59 | Source: Ambulatory Visit | Attending: Gastroenterology | Admitting: Gastroenterology

## 2019-10-19 DIAGNOSIS — Z01812 Encounter for preprocedural laboratory examination: Secondary | ICD-10-CM | POA: Diagnosis present

## 2019-10-19 DIAGNOSIS — Z20822 Contact with and (suspected) exposure to covid-19: Secondary | ICD-10-CM | POA: Insufficient documentation

## 2019-10-19 LAB — SARS CORONAVIRUS 2 (TAT 6-24 HRS): SARS Coronavirus 2: NEGATIVE

## 2019-10-20 ENCOUNTER — Other Ambulatory Visit: Payer: Self-pay

## 2019-10-20 ENCOUNTER — Encounter (HOSPITAL_COMMUNITY): Payer: Self-pay | Admitting: Gastroenterology

## 2019-10-20 NOTE — Progress Notes (Signed)
Pre call done for endo procedure on Monday 10/23/19. Patient states they have been quarantined since covid test and will remain so over the weekend. Patient has clear instructions of prep for colon. Patient confirmed she has a ride home. All questions addressed.

## 2019-10-23 ENCOUNTER — Encounter (HOSPITAL_COMMUNITY): Payer: Self-pay | Admitting: Gastroenterology

## 2019-10-23 ENCOUNTER — Other Ambulatory Visit: Payer: Self-pay

## 2019-10-23 ENCOUNTER — Encounter (HOSPITAL_COMMUNITY)
Admission: RE | Disposition: A | Discharge status: Home / Self Care | Payer: Self-pay | Source: Home / Self Care | Attending: Gastroenterology

## 2019-10-23 ENCOUNTER — Ambulatory Visit (HOSPITAL_COMMUNITY)
Admission: RE | Admit: 2019-10-23 | Discharge: 2019-10-23 | Disposition: A | Discharge status: Home / Self Care | Payer: 59 | Attending: Gastroenterology | Admitting: Gastroenterology

## 2019-10-23 ENCOUNTER — Ambulatory Visit (HOSPITAL_COMMUNITY): Payer: 59 | Admitting: Anesthesiology

## 2019-10-23 DIAGNOSIS — C7A8 Other malignant neuroendocrine tumors: Secondary | ICD-10-CM | POA: Diagnosis present

## 2019-10-23 DIAGNOSIS — K6289 Other specified diseases of anus and rectum: Secondary | ICD-10-CM

## 2019-10-23 DIAGNOSIS — K641 Second degree hemorrhoids: Secondary | ICD-10-CM | POA: Diagnosis not present

## 2019-10-23 DIAGNOSIS — I10 Essential (primary) hypertension: Secondary | ICD-10-CM | POA: Diagnosis not present

## 2019-10-23 HISTORY — PX: FLEXIBLE SIGMOIDOSCOPY: SHX5431

## 2019-10-23 HISTORY — PX: HEMOSTASIS CLIP PLACEMENT: SHX6857

## 2019-10-23 HISTORY — PX: EUS: SHX5427

## 2019-10-23 HISTORY — PX: SUBMUCOSAL LIFTING INJECTION: SHX6855

## 2019-10-23 HISTORY — PX: ENDOSCOPIC MUCOSAL RESECTION: SHX6839

## 2019-10-23 SURGERY — RESECTION, MUCOSAL LESION, GI TRACT, ENDOSCOPIC
Anesthesia: Monitor Anesthesia Care

## 2019-10-23 MED ORDER — LACTATED RINGERS IV SOLN: INTRAVENOUS | Status: DC | PRN

## 2019-10-23 MED ORDER — PROPOFOL 500 MG/50ML IV EMUL
INTRAVENOUS | Status: DC | PRN
  Administered 2019-10-23: 150 ug/kg/min via INTRAVENOUS

## 2019-10-23 MED ORDER — LIDOCAINE 2% (20 MG/ML) 5 ML SYRINGE
INTRAMUSCULAR | Status: DC | PRN
Start: 2019-10-23 — End: 2019-10-23
  Administered 2019-10-23: 40 mg via INTRAVENOUS

## 2019-10-23 MED ORDER — PROPOFOL 10 MG/ML IV BOLUS
INTRAVENOUS | Status: DC | PRN
  Administered 2019-10-23: 50 mg via INTRAVENOUS

## 2019-10-23 MED ORDER — SODIUM CHLORIDE 0.9 % IV SOLN: INTRAVENOUS | Status: DC

## 2019-10-23 NOTE — Anesthesia Procedure Notes (Signed)
Procedure Name: MAC Date/Time: 10/23/2019 8:52 AM Performed by: Renato Shin, CRNA Pre-anesthesia Checklist: Patient identified, Emergency Drugs available, Suction available and Patient being monitored Patient Re-evaluated:Patient Re-evaluated prior to induction Oxygen Delivery Method: Nasal cannula Preoxygenation: Pre-oxygenation with 100% oxygen Induction Type: IV induction Placement Confirmation: positive ETCO2 and breath sounds checked- equal and bilateral Dental Injury: Teeth and Oropharynx as per pre-operative assessment

## 2019-10-23 NOTE — Discharge Instructions (Signed)

## 2019-10-23 NOTE — H&P (Signed)
GASTROENTEROLOGY PROCEDURE H&P NOTE   Primary Care Physician: Rutherford Guys, MD  HPI: Andrea Palmer is a 49 y.o. female who presents for Flex Sigmoidoscopy with EUS and EMR of rectal Neuroendocrine tumor.  Past Medical History:  Diagnosis Date  . Anxiety   . HTN (hypertension)    Past Surgical History:  Procedure Laterality Date  . COLONOSCOPY    . TUBAL LIGATION     Current Facility-Administered Medications  Medication Dose Route Frequency Provider Last Rate Last Admin  . 0.9 %  sodium chloride infusion   Intravenous Continuous Mansouraty, Telford Nab., MD       No Known Allergies Family History  Problem Relation Age of Onset  . Congestive Heart Failure Mother   . Hypertension Sister   . Colon cancer Neg Hx   . Esophageal cancer Neg Hx   . Rectal cancer Neg Hx   . Stomach cancer Neg Hx    Social History   Socioeconomic History  . Marital status: Single    Spouse name: Not on file  . Number of children: 2  . Years of education: 40  . Highest education level: Not on file  Occupational History  . Occupation: Hair stylist   Tobacco Use  . Smoking status: Never Smoker  . Smokeless tobacco: Never Used  Vaping Use  . Vaping Use: Never used  Substance and Sexual Activity  . Alcohol use: No    Alcohol/week: 0.0 standard drinks  . Drug use: No  . Sexual activity: Yes    Partners: Male    Birth control/protection: None  Other Topics Concern  . Not on file  Social History Narrative   Born and raised in [place]. Currently resides in an apartment with her son. Has a rabbit. Fun: watch tv (law and order; criminal minds); Denies any religious beliefs that would effect health care.   Social Determinants of Health   Financial Resource Strain:   . Difficulty of Paying Living Expenses:   Food Insecurity:   . Worried About Charity fundraiser in the Last Year:   . Arboriculturist in the Last Year:   Transportation Needs:   . Film/video editor (Medical):     Marland Kitchen Lack of Transportation (Non-Medical):   Physical Activity:   . Days of Exercise per Week:   . Minutes of Exercise per Session:   Stress:   . Feeling of Stress :   Social Connections:   . Frequency of Communication with Friends and Family:   . Frequency of Social Gatherings with Friends and Family:   . Attends Religious Services:   . Active Member of Clubs or Organizations:   . Attends Archivist Meetings:   Marland Kitchen Marital Status:   Intimate Partner Violence:   . Fear of Current or Ex-Partner:   . Emotionally Abused:   Marland Kitchen Physically Abused:   . Sexually Abused:     Physical Exam: Vital signs in last 24 hours: Temp:  [98 F (36.7 C)] 98 F (36.7 C) (07/19 0810) Pulse Rate:  [84] 84 (07/19 0810) Resp:  [16] 16 (07/19 0810) BP: (164)/(100) 164/100 (07/19 0810) SpO2:  [99 %] 99 % (07/19 0810) Weight:  [67.6 kg] 67.6 kg (07/19 0810)   GEN: NAD EYE: Sclerae anicteric ENT: MMM CV: Non-tachycardic GI: Soft, NT/ND NEURO:  Alert & Oriented x 3  Lab Results: No results for input(s): WBC, HGB, HCT, PLT in the last 72 hours. BMET No results for input(s): NA,  K, CL, CO2, GLUCOSE, BUN, CREATININE, CALCIUM in the last 72 hours. LFT No results for input(s): PROT, ALBUMIN, AST, ALT, ALKPHOS, BILITOT, BILIDIR, IBILI in the last 72 hours. PT/INR No results for input(s): LABPROT, INR in the last 72 hours.   Impression / Plan: This is a 49 y.o.female who presents for Flex Sigmoidoscopy with EUS and EMR of rectal Neuroendocrine tumor.  The risks of an EUS including intestinal perforation, bleeding, infection, aspiration, and medication effects were discussed as was the possibility it may not give a definitive diagnosis if a biopsy is performed.  The risks and benefits of endoscopic evaluation were discussed with the patient; these include but are not limited to the risk of perforation, infection, bleeding, missed lesions, lack of diagnosis, severe illness requiring  hospitalization, as well as anesthesia and sedation related illnesses.  The patient is agreeable to proceed.    Justice Britain, MD [facility] Advanced [facility]oscopy Office # [phone]

## 2019-10-23 NOTE — Anesthesia Preprocedure Evaluation (Signed)
Anesthesia Evaluation  Patient identified by MRN, date of birth, ID band Patient awake    Reviewed: Allergy & Precautions, NPO status , Patient's Chart, lab work & pertinent test results  Airway Mallampati: II  TM Distance: >3 FB Neck ROM: Full    Dental  (+) Dental Advisory Given, Teeth Intact   Pulmonary neg pulmonary ROS,    Pulmonary exam normal breath sounds clear to auscultation       Cardiovascular hypertension, Pt. on medications Normal cardiovascular exam Rhythm:Regular Rate:Normal     Neuro/Psych PSYCHIATRIC DISORDERS Anxiety negative neurological ROS     GI/Hepatic negative GI ROS, Neg liver ROS,   Endo/Other  negative endocrine ROS  Renal/GU negative Renal ROS     Musculoskeletal negative musculoskeletal ROS (+)   Abdominal (+) - obese,   Peds  Hematology negative hematology ROS (+)   Anesthesia Other Findings   Reproductive/Obstetrics negative OB ROS                            Anesthesia Physical Anesthesia Plan  ASA: II  Anesthesia Plan: MAC   Post-op Pain Management:    Induction: Intravenous  PONV Risk Score and Plan: 2 and Propofol infusion, TIVA and Treatment may vary due to age or medical condition  Airway Management Planned: Natural Airway and Simple Face Mask  Additional Equipment: None  Intra-op Plan:   Post-operative Plan:   Informed Consent: I have reviewed the patients History and Physical, chart, labs and discussed the procedure including the risks, benefits and alternatives for the proposed anesthesia with the patient or authorized representative who has indicated his/her understanding and acceptance.     Dental advisory given  Plan Discussed with: CRNA  Anesthesia Plan Comments:         Anesthesia Quick Evaluation

## 2019-10-23 NOTE — Op Note (Addendum)
[facility] Patient Name: Andrea Palmer Procedure Date : 10/23/2019 MRN: [id number] Attending MD: Justice Britain , MD Date of Birth: 06/14/1970 CSN: [id number] Age: 49 Admit Type: Outpatient Procedure:                Lower EUS Indications:              Rectal deformity found on endoscopy; subepithelial                            tumor versus extrinsic compression, Neuroendocrine                            tumor found on recent Colonoscopy Providers:                Justice Britain, MD, Carlyn Reichert, RN, Theodora Blow, Technician Referring MD:             Thornton Park MD, MD, Lilia Argue. [place] Medicines:                Monitored Anesthesia Care Complications:            No immediate complications. Estimated Blood Loss:     Estimated blood loss was minimal. Procedure:                Pre-Anesthesia Assessment:                           - Prior to the procedure, a History and Physical                            was performed, and patient medications and                            allergies were reviewed. The patient's tolerance of                            previous anesthesia was also reviewed. The risks                            and benefits of the procedure and the sedation                            options and risks were discussed with the patient.                            All questions were answered, and informed consent                            was obtained. Prior Anticoagulants: The patient has                            taken no previous anticoagulant or antiplatelet  agents. ASA Grade Assessment: II - A patient with                            mild systemic disease. After reviewing the risks                            and benefits, the patient was deemed in                            satisfactory condition to undergo the procedure.                           After obtaining informed consent, the  endoscope was                            passed under direct vision. Throughout the                            procedure, the patient's blood pressure, pulse, and                            oxygen saturations were monitored continuously. The                            [id number] ([id number]) Olympus therapeutic                            gastroscope was introduced through the anus and                            advanced to the the left transverse colon. The                            [id number] ([id number]) Olympus Radial EUS scope was                            introduced through the anus and advanced to the the                            sigmoid colon for ultrasound. The lower EUS was                            accomplished without difficulty. The patient                            tolerated the procedure. Scope In: 8:55:24 AM Scope Out: 9:44:43 AM Total Procedure Duration: 0 hours 49 minutes 19 seconds  Findings:      The digital rectal exam findings include hemorrhoids. Pertinent       negatives include no palpable rectal lesions.      ENDOSCOPIC FINDING: :      A moderate amount of semi-liquid stool was found in the descending       colon, at the splenic flexure and in the transverse colon, interfering  with visualization.      One 6 mm submucosal nodule was found in the distal rectum. There was       evidence of slight scar site on the lesion due to recent biopsy. After       the EUS was completed, preparations were made for mucosal resection.       Orise gel was injected to raise the lesion. Captivator band ligator and       snare mucosal resection was performed. Resection and retrieval were       complete. To prevent bleeding after mucosal resection, five hemostatic       clips were successfully placed (MR conditional). There was no bleeding       during or at the end of the procedure.      Non-bleeding non-thrombosed internal hemorrhoids were found during       retroflexion,  during perianal exam and during digital exam. The       hemorrhoids were Grade II (internal hemorrhoids that prolapse but reduce       spontaneously).      ENDOSONOGRAPHIC FINDING: :      Local wall thickening was found in the rectum. This was encountered at 5       cm (from the anal verge). The site of thickening was non-circumferential       and located predominantly at the posterior rectal wall. This finding       appeared to be primarily due to increased thickness of the deep mucosa       (Layer 2). The rectal wall measured up to 6 mm in thickness in this       region. Overt nodularity was not present.      The rectosigmoid junction and sigmoid colon were normal.      No lymph nodes were seen in the perirectal region and in the left iliac       region.      The internal anal sphincter was visualized endosonographically and       appeared normal. Impression:               Flexible Sigmoidoscopy Impression:                           - Hemorrhoids found on digital rectal exam.                           - Stool in the descending colon, at the splenic                            flexure and in the transverse colon.                           - Submucosal nodule in the distal rectum. Complete                            removal was accomplished. Clips (MR conditional)                            were placed.                           - Non-bleeding non-thrombosed internal hemorrhoids.  EUS Impression:                           - Local wall thickening was visualized                            endosonographically in the rectum. This was due to                            increased thickness of the deep mucosa (Layer 2).                           - Endosonographic imaging showed no sign of                            significant pathology at the rectosigmoid junction                            and in the sigmoid colon.                           - No lymph nodes were seen  in the perirectal region                            and in the left iliac region during endosonographic                            examination.                           - The internal anal sphincter was visualized                            endosonographically and appeared normal. Recommendation:           - The patient will be observed post-procedure,                            until all discharge criteria are met.                           - Discharge patient to home.                           - Patient has a contact number available for                            emergencies. The signs and symptoms of potential                            delayed complications were discussed with the                            patient. Return to normal activities tomorrow.  Written discharge instructions were provided to the                            patient.                           - High fiber diet.                           - Await path results.                           - Repeat Lower EUS in 20-month if there positive                            margin or 1 year for surveillance if complete                            resection.                           - Full Colonoscopy follow up as per Dr. BTarri Glenn                           recommendations.                           - Discussion about potential imaging studies to be                            had after final pathology has returned.                           - The findings and recommendations were discussed                            with the patient.                           - The findings and recommendations were discussed                            with the designated responsible adult. Procedure Code(s):        --- Professional ---                           [id number] Sigmoidoscopy, flexible; with endoscopic                            mucosal resection                           [id number] Sigmoidoscopy, flexible; with  endoscopic                            ultrasound examination Diagnosis Code(s):        --- Professional ---  K64.1, Second degree hemorrhoids                           K62.89, Other specified diseases of anus and rectum CPT copyright 2019 American Medical Association. All rights reserved. The codes documented in this report are preliminary and upon coder review may  be revised to meet current compliance requirements. Justice Britain, MD 10/23/2019 10:05:39 AM Number of Addenda: 0

## 2019-10-23 NOTE — Transfer of Care (Signed)
Immediate Anesthesia Transfer of Care Note  Patient: Andrea Palmer  Procedure(s) Performed: ENDOSCOPIC MUCOSAL RESECTION (N/A ) LOWER ENDOSCOPIC ULTRASOUND (EUS) (N/A ) FLEXIBLE SIGMOIDOSCOPY (N/A )  Patient Location: PACU  Anesthesia Type:MAC  Level of Consciousness: drowsy and patient cooperative  Airway & Oxygen Therapy: Patient Spontanous Breathing and Patient connected to nasal cannula oxygen  Post-op Assessment: Report given to RN and Post -op Vital signs reviewed and stable  Post vital signs: Reviewed and stable  Last Vitals:  Vitals Value Taken Time  BP 130/75 10/23/19 0951  Temp    Pulse 63 10/23/19 0952  Resp 19 10/23/19 0952  SpO2 100 % 10/23/19 0952  Vitals shown include unvalidated device data.  Last Pain:  Vitals:   10/23/19 0810  TempSrc: Oral  PainSc: 0-No pain         Complications: No complications documented.

## 2019-10-23 NOTE — Anesthesia Postprocedure Evaluation (Signed)
Anesthesia Post Note  Patient: Andrea Palmer  Procedure(s) Performed: ENDOSCOPIC MUCOSAL RESECTION (N/A ) LOWER ENDOSCOPIC ULTRASOUND (EUS) (N/A ) FLEXIBLE SIGMOIDOSCOPY (N/A ) SUBMUCOSAL LIFTING INJECTION HEMOSTASIS CLIP PLACEMENT     Patient location during evaluation: PACU Anesthesia Type: MAC Level of consciousness: awake and alert Pain management: pain level controlled Vital Signs Assessment: post-procedure vital signs reviewed and stable Respiratory status: spontaneous breathing Cardiovascular status: stable Anesthetic complications: no   No complications documented.  Last Vitals:  Vitals:   10/23/19 1005 10/23/19 1014  BP: (!) 149/96 (!) 158/89  Pulse: 61 61  Resp: 18 12  Temp:  36.4 C  SpO2: 100% 100%    Last Pain:  Vitals:   10/23/19 1014  TempSrc:   PainSc: 0-No pain                 Nolon Nations

## 2019-10-25 LAB — SURGICAL PATHOLOGY

## 2019-10-27 ENCOUNTER — Encounter (HOSPITAL_COMMUNITY): Payer: Self-pay | Admitting: Gastroenterology

## 2019-10-31 ENCOUNTER — Ambulatory Visit (INDEPENDENT_AMBULATORY_CARE_PROVIDER_SITE_OTHER): Payer: 59 | Admitting: Family Medicine

## 2019-10-31 ENCOUNTER — Encounter: Payer: Self-pay | Admitting: Family Medicine

## 2019-10-31 ENCOUNTER — Other Ambulatory Visit: Payer: Self-pay

## 2019-10-31 VITALS — BP 147/88 | HR 64 | Temp 97.7°F | Ht 62.0 in | Wt 151.0 lb

## 2019-10-31 DIAGNOSIS — I10 Essential (primary) hypertension: Secondary | ICD-10-CM

## 2019-10-31 MED ORDER — ENALAPRIL MALEATE 20 MG PO TABS: 20.0000 mg | ORAL_TABLET | Freq: Every day | ORAL | 2 refills | Status: DC

## 2019-10-31 NOTE — Progress Notes (Signed)
7/27/202110:01 AM  Andrea Palmer 03/27/1971, 49 y.o., female [id number]  Chief Complaint  Patient presents with  . Hypertension    follow up , no dizzyness or headaches     HPI:   Patient is a 49 y.o. female with past medical history significant for  HTN, GAD, colonic polyps, rectal carcinoid  who presents today for HTN followup  Last OV June 2021 -  Increased enalapril to 10mg  daily  She repots that at home BP has been running similar to today She denies any snoring, sleeps well Tolerating enalapril 10mg  daily Denies any increase in salt intake, caffeine, salt intake   BP Readings from Last 3 Encounters:  10/31/19 (!) 147/88  10/23/19 (!) 158/89  10/03/19 (!) 151/89   Lab Results  Component Value Date   CREATININE 0.72 07/05/2019   BUN 12 07/05/2019   NA 139 07/05/2019   K 4.0 07/05/2019   CL 104 07/05/2019   CO2 24 07/05/2019    Depression screen PHQ 2/9 10/31/2019 10/03/2019 08/04/2019  Decreased Interest 0 0 0  Down, Depressed, Hopeless 0 0 0  PHQ - 2 Score 0 0 0    Fall Risk  10/31/2019 10/03/2019 08/04/2019 07/05/2019 06/06/2019  Falls in the past year? 0 0 0 0 0  Number falls in past yr: 0 0 0 0 0  Injury with Fall? 0 0 0 0 0  Follow up Falls evaluation completed Falls evaluation completed Falls evaluation completed Falls evaluation completed Falls evaluation completed     No Known Allergies  Prior to Admission medications   Medication Sig Start Date End Date Taking? Authorizing Provider  b complex vitamins tablet Take 1 tablet by mouth daily.   Yes [provider]  cholecalciferol (VITAMIN D3) 25 MCG (1000 UNIT) tablet Take 1,000 Units by mouth daily.   Yes [provider]  Coenzyme Q10 (COQ10) 100 MG CAPS Take 100 mg by mouth daily.   Yes [provider]  enalapril (VASOTEC) 10 MG tablet Take 1 tablet (10 mg total) by mouth daily. 10/03/19  Yes Rutherford Guys, MD  metoprolol succinate (TOPROL-XL) 25 MG 24 hr tablet Take 1  tablet (25 mg total) by mouth daily. 10/03/19  Yes Rutherford Guys, MD  OLANZapine (ZYPREXA) 5 MG tablet Take 1 tablet (5 mg total) by mouth at bedtime. 10/03/19  Yes Rutherford Guys, MD    Past Medical History:  Diagnosis Date  . Anxiety   . HTN (hypertension)     Past Surgical History:  Procedure Laterality Date  . COLONOSCOPY    . ENDOSCOPIC MUCOSAL RESECTION N/A 10/23/2019   Procedure: ENDOSCOPIC MUCOSAL RESECTION;  Surgeon: Rush Landmark Telford Nab., MD;  Location: [place];  Service: Gastroenterology;  Laterality: N/A;  . EUS N/A 10/23/2019   Procedure: LOWER ENDOSCOPIC ULTRASOUND (EUS);  Surgeon: Irving Copas., MD;  Location: [facility];  Service: Gastroenterology;  Laterality: N/A;  . FLEXIBLE SIGMOIDOSCOPY N/A 10/23/2019   Procedure: FLEXIBLE SIGMOIDOSCOPY;  Surgeon: Rush Landmark Telford Nab., MD;  Location: [facility];  Service: Gastroenterology;  Laterality: N/A;  . HEMOSTASIS CLIP PLACEMENT  10/23/2019   Procedure: HEMOSTASIS CLIP PLACEMENT;  Surgeon: Irving Copas., MD;  Location: [facility];  Service: Gastroenterology;;  . Lia Foyer LIFTING INJECTION  10/23/2019   Procedure: SUBMUCOSAL LIFTING INJECTION;  Surgeon: Irving Copas., MD;  Location: [facility];  Service: Gastroenterology;;  . TUBAL LIGATION      Social History   Tobacco Use  . Smoking status: Never Smoker  .  Smokeless tobacco: Never Used  Substance Use Topics  . Alcohol use: No    Alcohol/week: 0.0 standard drinks    Family History  Problem Relation Age of Onset  . Congestive Heart Failure Mother   . Hypertension Sister   . Colon cancer Neg Hx   . Esophageal cancer Neg Hx   . Rectal cancer Neg Hx   . Stomach cancer Neg Hx     Review of Systems  Constitutional: Negative for chills and fever.  Respiratory: Negative for cough and shortness of breath.   Cardiovascular: Negative for chest pain, palpitations and leg swelling.  Gastrointestinal: Negative for  abdominal pain, nausea and vomiting.     OBJECTIVE:  Today's Vitals   10/31/19 0929 10/31/19 0944  BP: (!) 165/95 (!) 147/88  Pulse: 64   Temp: 97.7 F (36.5 C)   SpO2: 100%   Weight: 151 lb (68.5 kg)   Height: 5\' 2"  (1.575 m)    Body mass index is 27.62 kg/m.   Wt Readings from Last 3 Encounters:  10/31/19 151 lb (68.5 kg)  10/23/19 149 lb (67.6 kg)  10/03/19 149 lb (67.6 kg)     Physical Exam Vitals and nursing note reviewed.  Constitutional:      Appearance: She is well-developed.  HENT:     Head: Normocephalic and atraumatic.     Mouth/Throat:     Pharynx: No oropharyngeal exudate.  Eyes:     General: No scleral icterus.    Extraocular Movements: Extraocular movements intact.     Conjunctiva/sclera: Conjunctivae normal.     Pupils: Pupils are equal, round, and reactive to light.  Cardiovascular:     Rate and Rhythm: Normal rate and regular rhythm.     Heart sounds: Normal heart sounds. No murmur heard.  No friction rub. No gallop.   Pulmonary:     Effort: Pulmonary effort is normal.     Breath sounds: Normal breath sounds. No wheezing, rhonchi or rales.  Musculoskeletal:     Cervical back: Neck supple.     Right lower leg: No edema.     Left lower leg: No edema.  Skin:    General: Skin is warm and dry.  Neurological:     Mental Status: She is alert and oriented to person, place, and time.     No results found for this or any previous visit (from the past 24 hour(s)).  No results found.   ASSESSMENT and PLAN  1. Essential hypertension Not at goal. Increase enalapril to 20mg  daily. Cont with LFM If not at goal at next check, add HCTZ. Needs BMP at next OV Other orders - enalapril (VASOTEC) 20 MG tablet; Take 1 tablet (20 mg total) by mouth daily.  Return in about 4 weeks (around 11/28/2019) for bp.    Rutherford Guys, MD Primary Care at [place], [place] [address] Ph.  [phone] Fax [phone]

## 2019-10-31 NOTE — Patient Instructions (Signed)
° ° ° °  If you have lab work done today you will be contacted with your lab results within the next 2 weeks.  If you have not heard from us then please contact us. The fastest way to get your results is to register for My Chart. ° ° °IF you received an x-ray today, you will receive an invoice from [facility]. Please contact [facility] at [phone] with questions or concerns regarding your invoice.  ° °IF you received labwork today, you will receive an invoice from [facility]. Please contact [facility] at [phone] with questions or concerns regarding your invoice.  ° °Our billing staff will not be able to assist you with questions regarding bills from these companies. ° °You will be contacted with the lab results as soon as they are available. The fastest way to get your results is to activate your My Chart account. Instructions are located on the last page of this paperwork. If you have not heard from us regarding the results in 2 weeks, please contact this office. °  ° ° ° °

## 2019-11-03 ENCOUNTER — Encounter: Payer: Self-pay | Admitting: Gastroenterology

## 2019-11-27 NOTE — Progress Notes (Signed)
Please confirm that Dr. Donneta Romberg recommendations have been scheduled. I would be happy to see the patient in the meantime for any ongoing symptoms. Thank you.

## 2019-12-01 ENCOUNTER — Encounter: Payer: Self-pay | Admitting: Family Medicine

## 2019-12-01 ENCOUNTER — Other Ambulatory Visit: Payer: Self-pay

## 2019-12-01 ENCOUNTER — Ambulatory Visit (INDEPENDENT_AMBULATORY_CARE_PROVIDER_SITE_OTHER): Payer: 59 | Admitting: Family Medicine

## 2019-12-01 VITALS — BP 140/94 | HR 77 | Temp 97.2°F | Ht 62.0 in | Wt 151.2 lb

## 2019-12-01 DIAGNOSIS — I10 Essential (primary) hypertension: Secondary | ICD-10-CM | POA: Diagnosis not present

## 2019-12-01 MED ORDER — ENALAPRIL MALEATE 20 MG PO TABS: 20.0000 mg | ORAL_TABLET | Freq: Every day | ORAL | 1 refills | Status: DC

## 2019-12-01 MED ORDER — METOPROLOL SUCCINATE ER 50 MG PO TB24: 50.0000 mg | ORAL_TABLET | Freq: Every day | ORAL | 1 refills | Status: DC

## 2019-12-01 NOTE — Progress Notes (Signed)
8/27/20219:14 AM  Andrea Palmer 12-17-1970, 49 y.o., female [id number]  Chief Complaint  Patient presents with  . Hypertension    does monitor bp at home, stays in the high range    HPI:   Patient is a 49 y.o. female with past medical history significant for HTN, GAD, colonic polyps, rectal carcinoid who presents today for HTN followup  Last OV a month ago - increased enalapril to 20mg  daily  She is overall doing well She has been checking her BP before she takes her med in he morning, she is waking up with BP 170/100s After she takes her med she goes down to 140/90s She takes enalapril in AM and takes metoprolol at night  BP Readings from Last 3 Encounters:  12/01/19 (!) 179/120  10/31/19 (!) 147/88  10/23/19 (!) 158/89   Lab Results  Component Value Date   CREATININE 0.72 07/05/2019   BUN 12 07/05/2019   NA 139 07/05/2019   K 4.0 07/05/2019   CL 104 07/05/2019   CO2 24 07/05/2019     Depression screen PHQ 2/9 10/31/2019 10/03/2019 08/04/2019  Decreased Interest 0 0 0  Down, Depressed, Hopeless 0 0 0  PHQ - 2 Score 0 0 0    Fall Risk  10/31/2019 10/03/2019 08/04/2019 07/05/2019 06/06/2019  Falls in the past year? 0 0 0 0 0  Number falls in past yr: 0 0 0 0 0  Injury with Fall? 0 0 0 0 0  Follow up Falls evaluation completed Falls evaluation completed Falls evaluation completed Falls evaluation completed Falls evaluation completed     No Known Allergies  Prior to Admission medications   Medication Sig Start Date End Date Taking? Authorizing Provider  b complex vitamins tablet Take 1 tablet by mouth daily.   Yes [provider]  cholecalciferol (VITAMIN D3) 25 MCG (1000 UNIT) tablet Take 1,000 Units by mouth daily.   Yes [provider]  Coenzyme Q10 (COQ10) 100 MG CAPS Take 100 mg by mouth daily.   Yes [provider]  enalapril (VASOTEC) 20 MG tablet Take 1 tablet (20 mg total) by mouth daily. 10/31/19  Yes Rutherford Guys, MD   metoprolol succinate (TOPROL-XL) 25 MG 24 hr tablet Take 1 tablet (25 mg total) by mouth daily. 10/03/19  Yes Rutherford Guys, MD  OLANZapine (ZYPREXA) 5 MG tablet Take 1 tablet (5 mg total) by mouth at bedtime. 10/03/19  Yes Rutherford Guys, MD    Past Medical History:  Diagnosis Date  . Anxiety   . HTN (hypertension)     Past Surgical History:  Procedure Laterality Date  . COLONOSCOPY    . ENDOSCOPIC MUCOSAL RESECTION N/A 10/23/2019   Procedure: ENDOSCOPIC MUCOSAL RESECTION;  Surgeon: Rush Landmark Telford Nab., MD;  Location: [facility];  Service: Gastroenterology;  Laterality: N/A;  . EUS N/A 10/23/2019   Procedure: LOWER ENDOSCOPIC ULTRASOUND (EUS);  Surgeon: Irving Copas., MD;  Location: [facility];  Service: Gastroenterology;  Laterality: N/A;  . FLEXIBLE SIGMOIDOSCOPY N/A 10/23/2019   Procedure: FLEXIBLE SIGMOIDOSCOPY;  Surgeon: Rush Landmark Telford Nab., MD;  Location: [facility];  Service: Gastroenterology;  Laterality: N/A;  . HEMOSTASIS CLIP PLACEMENT  10/23/2019   Procedure: HEMOSTASIS CLIP PLACEMENT;  Surgeon: Irving Copas., MD;  Location: [facility];  Service: Gastroenterology;;  . Lia Foyer LIFTING INJECTION  10/23/2019   Procedure: SUBMUCOSAL LIFTING INJECTION;  Surgeon: Irving Copas., MD;  Location: [facility];  Service: Gastroenterology;;  . TUBAL LIGATION  Social History   Tobacco Use  . Smoking status: Never Smoker  . Smokeless tobacco: Never Used  Substance Use Topics  . Alcohol use: No    Alcohol/week: 0.0 standard drinks    Family History  Problem Relation Age of Onset  . Congestive Heart Failure Mother   . Hypertension Sister   . Colon cancer Neg Hx   . Esophageal cancer Neg Hx   . Rectal cancer Neg Hx   . Stomach cancer Neg Hx     Review of Systems  Constitutional: Negative for chills and fever.  Eyes: Negative for blurred vision and double vision.  Respiratory: Negative for cough and shortness of breath.    Cardiovascular: Negative for chest pain, palpitations and leg swelling.  Gastrointestinal: Negative for abdominal pain, nausea and vomiting.  Neurological: Negative for dizziness, sensory change, speech change, focal weakness and headaches.     OBJECTIVE:  Today's Vitals   12/01/19 0909 12/01/19 0920  BP: (!) 179/120 (!) 140/94  Pulse: 77   Temp: (!) 97.2 F (36.2 C)   SpO2: 99%   Weight: 151 lb 3.2 oz (68.6 kg)   Height: 5\' 2"  (1.575 m)    Body mass index is 27.65 kg/m.   Physical Exam Vitals and nursing note reviewed.  Constitutional:      Appearance: She is well-developed.  HENT:     Head: Normocephalic and atraumatic.     Mouth/Throat:     Pharynx: No oropharyngeal exudate.  Eyes:     General: No scleral icterus.    Extraocular Movements: Extraocular movements intact.     Conjunctiva/sclera: Conjunctivae normal.     Pupils: Pupils are equal, round, and reactive to light.  Cardiovascular:     Rate and Rhythm: Normal rate and regular rhythm.     Heart sounds: Normal heart sounds. No murmur heard.  No friction rub. No gallop.   Pulmonary:     Effort: Pulmonary effort is normal.     Breath sounds: Normal breath sounds. No wheezing, rhonchi or rales.  Musculoskeletal:     Cervical back: Neck supple.  Skin:    General: Skin is warm and dry.  Neurological:     Mental Status: She is alert and oriented to person, place, and time.     No results found for this or any previous visit (from the past 24 hour(s)).  No results found.   ASSESSMENT and PLAN  1. Essential hypertension Not at goal. Increasing metoprolol (her bedtime BP med). Cont enalapril. Cont home BP monitoring, report readings in 2 weeks. RTC precautions given  Other orders - metoprolol succinate (TOPROL-XL) 50 MG 24 hr tablet; Take 1 tablet (50 mg total) by mouth at bedtime. - enalapril (VASOTEC) 20 MG tablet; Take 1 tablet (20 mg total) by mouth daily.  Return in about 3 months (around  03/02/2020).    Rutherford Guys, MD Primary Care at [place], [place] [address] Ph.  [phone] Fax [phone]

## 2019-12-01 NOTE — Patient Instructions (Addendum)
pcp  Please send me BP readings for past 2 weeks  If you have lab work done today you will be contacted with your lab results within the next 2 weeks.  If you have not heard from [place] then please contact us. The fastest way to get your results is to register for My Chart.   IF you received an x-ray today, you will receive an invoice from [facility]. Please contact [facility] at [phone] with questions or concerns regarding your invoice.   IF you received labwork today, you will receive an invoice from [facility]. Please contact [facility] at [phone] with questions or concerns regarding your invoice.   Our billing staff will not be able to assist you with questions regarding bills from these companies.  You will be contacted with the lab results as soon as they are available. The fastest way to get your results is to activate your My Chart account. Instructions are located on the last page of this paperwork. If you have not heard from [place] regarding the results in 2 weeks, please contact this office.

## 2019-12-20 ENCOUNTER — Encounter: Payer: Self-pay | Admitting: Family Medicine

## 2020-01-16 ENCOUNTER — Telehealth: Payer: Self-pay

## 2020-01-16 DIAGNOSIS — K6289 Other specified diseases of anus and rectum: Secondary | ICD-10-CM

## 2020-01-16 NOTE — Telephone Encounter (Signed)
-----   Message from Irving Copas., MD sent at 01/16/2020  4:10 PM EDT ----- Regarding: Follow up Cheridan Kibler, I wanted to make sure that the planned follow up as outlined in my letter of results gets completed. Can you work on getting the labs and CT scans ordered? Thanks. GM

## 2020-01-23 NOTE — Telephone Encounter (Signed)
I recommend in 1 to 2 months that you have a blood test known as chromogranin A performed. Message sent to the pt via My Chart.  Order entered.  CT not due until January

## 2020-01-24 ENCOUNTER — Other Ambulatory Visit: Payer: 59

## 2020-01-24 DIAGNOSIS — K6289 Other specified diseases of anus and rectum: Secondary | ICD-10-CM

## 2020-01-26 LAB — CHROMOGRANIN A: Chromogranin A (ng/mL): 64.1 ng/mL (ref 0.0–101.8)

## 2020-01-31 ENCOUNTER — Ambulatory Visit (INDEPENDENT_AMBULATORY_CARE_PROVIDER_SITE_OTHER): Payer: 59 | Admitting: Family Medicine

## 2020-01-31 ENCOUNTER — Encounter: Payer: Self-pay | Admitting: Family Medicine

## 2020-01-31 ENCOUNTER — Other Ambulatory Visit: Payer: Self-pay

## 2020-01-31 VITALS — BP 160/87 | HR 74 | Temp 98.3°F | Ht 62.0 in | Wt 154.0 lb

## 2020-01-31 DIAGNOSIS — I10 Essential (primary) hypertension: Secondary | ICD-10-CM | POA: Diagnosis not present

## 2020-01-31 DIAGNOSIS — Z0000 Encounter for general adult medical examination without abnormal findings: Secondary | ICD-10-CM

## 2020-01-31 DIAGNOSIS — Z1231 Encounter for screening mammogram for malignant neoplasm of breast: Secondary | ICD-10-CM | POA: Diagnosis not present

## 2020-01-31 LAB — LIPID PANEL

## 2020-01-31 MED ORDER — ENALAPRIL MALEATE 20 MG PO TABS: 30.0000 mg | ORAL_TABLET | Freq: Every day | ORAL | 1 refills | Status: DC

## 2020-01-31 NOTE — Progress Notes (Signed)
10/27/202110:26 AM  Andrea Palmer [birth year], 49 y.o., female [id number]  Chief Complaint  Patient presents with  . Hypertension    monitors at home   . Transitions Of Care    HPI:   Patient is a 49 y.o. female with past medical history significant for HTN, GAD, colonic polyps, rectal carcinoidwho presents today for HTN followup.  Doing well at home. No current issues   Hypertension BP Readings from Last 3 Encounters:  01/31/20 (!) 160/87  12/01/19 (!) 140/94  10/31/19 (!) 147/88   Takes BP at home daily BP manual recheck 160/90 Took medications this morning Enalapril 20mg  daily AM Metoprolol 50mg  daily PM Last visit Increased metoprolol (her bedtime BP med). Cont enalapril. Cont home BP monitoring,   ScreeningsPer patient Mammogram: Last November done 2020,  Will schedule for 2021 Colon Cancer: Colonoscopy [facility] 3 months ago, had a small tumor and they went back in and removed it. PaP: Last October   Depression screen [facility] 2/9 01/31/2020 10/31/2019 10/03/2019  Decreased Interest 0 0 0  Down, Depressed, Hopeless 0 0 0  PHQ - 2 Score 0 0 0    Fall Risk  01/31/2020 10/31/2019 10/03/2019 08/04/2019 07/05/2019  Falls in the past year? 0 0 0 0 0  Number falls in past yr: 0 0 0 0 0  Injury with Fall? 0 0 0 0 0  Follow up Falls evaluation completed Falls evaluation completed Falls evaluation completed Falls evaluation completed Falls evaluation completed     No Known Allergies  Prior to Admission medications   Medication Sig Start Date End Date Taking? Authorizing Provider  b complex vitamins tablet Take 1 tablet by mouth daily.   Yes [provider]  cholecalciferol (VITAMIN D3) 25 MCG (1000 UNIT) tablet Take 1,000 Units by mouth daily.   Yes [provider]  enalapril (VASOTEC) 20 MG tablet Take 1 tablet (20 mg total) by mouth daily. 12/01/19  Yes Rutherford Guys, MD  metoprolol succinate (TOPROL-XL) 50 MG 24 hr tablet Take 1 tablet (50 mg  total) by mouth at bedtime. 12/01/19  Yes Rutherford Guys, MD  OLANZapine (ZYPREXA) 5 MG tablet Take 1 tablet (5 mg total) by mouth at bedtime. 10/03/19  Yes Rutherford Guys, MD  Coenzyme Q10 (COQ10) 100 MG CAPS Take 100 mg by mouth daily. Patient not taking: Reported on 01/31/2020    [provider]    Past Medical History:  Diagnosis Date  . Anxiety   . HTN (hypertension)     Past Surgical History:  Procedure Laterality Date  . COLONOSCOPY    . ENDOSCOPIC MUCOSAL RESECTION N/A 10/23/2019   Procedure: ENDOSCOPIC MUCOSAL RESECTION;  Surgeon: Rush Landmark Telford Nab., MD;  Location: [facility];  Service: Gastroenterology;  Laterality: N/A;  . EUS N/A 10/23/2019   Procedure: LOWER ENDOSCOPIC ULTRASOUND (EUS);  Surgeon: Irving Copas., MD;  Location: [facility];  Service: Gastroenterology;  Laterality: N/A;  . FLEXIBLE SIGMOIDOSCOPY N/A 10/23/2019   Procedure: FLEXIBLE SIGMOIDOSCOPY;  Surgeon: Rush Landmark Telford Nab., MD;  Location: [facility];  Service: Gastroenterology;  Laterality: N/A;  . HEMOSTASIS CLIP PLACEMENT  10/23/2019   Procedure: HEMOSTASIS CLIP PLACEMENT;  Surgeon: Irving Copas., MD;  Location: [facility];  Service: Gastroenterology;;  . Lia Foyer LIFTING INJECTION  10/23/2019   Procedure: SUBMUCOSAL LIFTING INJECTION;  Surgeon: Irving Copas., MD;  Location: [facility];  Service: Gastroenterology;;  . TUBAL LIGATION      Social History   Tobacco Use  . Smoking  status: Never Smoker  . Smokeless tobacco: Never Used  Substance Use Topics  . Alcohol use: No    Alcohol/week: 0.0 standard drinks    Family History  Problem Relation Age of Onset  . Congestive Heart Failure Mother   . Hypertension Sister   . Colon cancer Neg Hx   . Esophageal cancer Neg Hx   . Rectal cancer Neg Hx   . Stomach cancer Neg Hx     Review of Systems  Constitutional: Negative for chills, fever and malaise/fatigue.  Eyes: Negative for blurred  vision and double vision.  Respiratory: Negative for cough, shortness of breath and wheezing.   Cardiovascular: Negative for chest pain, palpitations and leg swelling.  Gastrointestinal: Negative for abdominal pain, blood in stool, constipation, diarrhea, heartburn, nausea and vomiting.  Genitourinary: Negative for dysuria, frequency and hematuria.  Musculoskeletal: Negative for back pain and joint pain.  Skin: Negative for rash.  Neurological: Negative for dizziness, weakness and headaches.     OBJECTIVE:  Today's Vitals   01/31/20 1000 01/31/20 1007  BP: (!) 175/115 (!) 160/87  Pulse: 74   Temp: 98.3 F (36.8 C)   SpO2: 99%   Weight: 154 lb (69.9 kg)   Height: 5\' 2"  (1.575 m)    Body mass index is 28.17 kg/m.   Physical Exam Constitutional:      General: She is not in acute distress.    Appearance: Normal appearance. She is not ill-appearing.  HENT:     Head: Normocephalic.  Cardiovascular:     Rate and Rhythm: Normal rate and regular rhythm.     Pulses: Normal pulses.     Heart sounds: Normal heart sounds. No murmur heard.  No friction rub. No gallop.   Pulmonary:     Effort: Pulmonary effort is normal. No respiratory distress.     Breath sounds: Normal breath sounds. No stridor. No wheezing, rhonchi or rales.  Abdominal:     General: Bowel sounds are normal.     Palpations: Abdomen is soft.     Tenderness: There is no abdominal tenderness.  Musculoskeletal:     Right lower leg: No edema.     Left lower leg: No edema.  Skin:    General: Skin is warm and dry.  Neurological:     Mental Status: She is alert and oriented to person, place, and time.  Psychiatric:        Mood and Affect: Mood normal.        Behavior: Behavior normal.     No results found for this or any previous visit (from the past 24 hour(s)).  No results found.   ASSESSMENT and PLAN  Problem List Items Addressed This Visit      Cardiovascular and Mediastinum   Essential hypertension  (Chronic)   Relevant Medications   enalapril (VASOTEC) 20 MG tablet  Increase enalapril to 30mg  daily Continue metoprolol dose Please send me BP readings in 1 week. RTC instuctions given. Discussed BP medication options, given her home BP in the morning is within goal we increased daytime dose. Discussed possibility of starting HCTZ, she wanted to increase her current medication doses first.    Other Visit Diagnoses    Visit for annual health examination    -  Primary   Relevant Orders: Will follow up with lab results   CBC   Comprehensive metabolic panel   TSH   Vitamin D, 25-hydroxy   Lipid Panel   Encounter for screening mammogram for malignant neoplasm  of breast           Return in about 4 weeks (around 02/28/2020) for BP medication follow up.    Huston Foley Brittanee Ghazarian, FNP-BC Primary Care at [place], [place] [address] Ph.  [phone] Fax [phone]

## 2020-01-31 NOTE — Patient Instructions (Addendum)
Increase enalapril Continue metoprolol dose Please send me BP readings in 1 week.    We recommend that you schedule a mammogram for breast cancer screening. Typically, you do not need a referral to do this. Please contact a local imaging center to schedule your mammogram.  [facility] - [phone]  *ask for the Radiology Department [facility] ([place]) - [phone] or [phone]  [facility] - [phone] [place] [phone] [facility] - [phone]  *ask for the [facility] - [phone]  *ask for the Radiology Department [facility] - [phone]  *ask for the [facility] - [phone]   [facility]. Monthly availability  Managing Your Hypertension Hypertension is commonly called high blood pressure. This is when the force of your blood pressing against the walls of your arteries is too strong. Arteries are blood vessels that carry blood from your heart throughout your body. Hypertension forces the heart to work harder to pump blood, and may cause the arteries to become narrow or stiff. Having untreated or uncontrolled hypertension can cause heart attack, stroke, kidney disease, and other problems. What are blood pressure readings? A blood pressure reading consists of a higher number over a lower number. Ideally, your blood pressure should be below 120/80. The first ("top") number is called the systolic pressure. It is a measure of the pressure in your arteries as your heart beats. The second ("bottom") number is called the diastolic pressure. It is a measure of the pressure in your arteries as the heart relaxes. What does my blood pressure reading mean? Blood pressure is classified into four stages. Based on your blood pressure reading, your health care provider may use the following stages to determine what type of  treatment you need, if any. Systolic pressure and diastolic pressure are measured in a unit called mm Hg. Normal  Systolic pressure: below 009.  Diastolic pressure: below 80. Elevated  Systolic pressure: 381-829.  Diastolic pressure: below 80. Hypertension stage 1  Systolic pressure: 937-169.  Diastolic pressure: 67-89. Hypertension stage 2  Systolic pressure: 381 or above.  Diastolic pressure: 90 or above. What health risks are associated with hypertension? Managing your hypertension is an important responsibility. Uncontrolled hypertension can lead to:  A heart attack.  A stroke.  A weakened blood vessel (aneurysm).  Heart failure.  Kidney damage.  Eye damage.  Metabolic syndrome.  Memory and concentration problems. What changes can I make to manage my hypertension? Hypertension can be managed by making lifestyle changes and possibly by taking medicines. Your health care provider will help you make a plan to bring your blood pressure within a normal range. Eating and drinking   Eat a diet that is high in fiber and potassium, and low in salt (sodium), added sugar, and fat. An example eating plan is called the DASH (Dietary Approaches to Stop Hypertension) diet. To eat this way: ? Eat plenty of fresh fruits and vegetables. Try to fill half of your plate at each meal with fruits and vegetables. ? Eat whole grains, such as whole wheat pasta, brown rice, or whole grain bread. Fill about one quarter of your plate with whole grains. ? Eat low-fat diary products. ? Avoid fatty cuts of meat, processed or cured meats, and poultry with skin. Fill about one quarter of your plate with lean proteins such as fish, chicken without skin, beans, eggs, and tofu. ? Avoid  premade and processed foods. These tend to be higher in sodium, added sugar, and fat.  Reduce your daily sodium intake. Most people with hypertension should eat less than 1,500 mg of sodium a day.  Limit alcohol  intake to no more than 1 drink a day for nonpregnant women and 2 drinks a day for men. One drink equals 12 oz of beer, 5 oz of wine, or 1 oz of hard liquor. Lifestyle  Work with your health care provider to maintain a healthy body weight, or to lose weight. Ask what an ideal weight is for you.  Get at least 30 minutes of exercise that causes your heart to beat faster (aerobic exercise) most days of the week. Activities may include walking, swimming, or biking.  Include exercise to strengthen your muscles (resistance exercise), such as weight lifting, as part of your weekly exercise routine. Try to do these types of exercises for 30 minutes at least 3 days a week.  Do not use any products that contain nicotine or tobacco, such as cigarettes and e-cigarettes. If you need help quitting, ask your health care provider.  Control any long-term (chronic) conditions you have, such as high cholesterol or diabetes. Monitoring  Monitor your blood pressure at home as told by your health care provider. Your personal target blood pressure may vary depending on your medical conditions, your age, and other factors.  Have your blood pressure checked regularly, as often as told by your health care provider. Working with your health care provider  Review all the medicines you take with your health care provider because there may be side effects or interactions.  Talk with your health care provider about your diet, exercise habits, and other lifestyle factors that may be contributing to hypertension.  Visit your health care provider regularly. Your health care provider can help you create and adjust your plan for managing hypertension. Will I need medicine to control my blood pressure? Your health care provider may prescribe medicine if lifestyle changes are not enough to get your blood pressure under control, and if:  Your systolic blood pressure is 130 or higher.  Your diastolic blood pressure is 80 or  higher. Take medicines only as told by your health care provider. Follow the directions carefully. Blood pressure medicines must be taken as prescribed. The medicine does not work as well when you skip doses. Skipping doses also puts you at risk for problems. Contact a health care provider if:  You think you are having a reaction to medicines you have taken.  You have repeated (recurrent) headaches.  You feel dizzy.  You have swelling in your ankles.  You have trouble with your vision. Get help right away if:  You develop a severe headache or confusion.  You have unusual weakness or numbness, or you feel faint.  You have severe pain in your chest or abdomen.  You vomit repeatedly.  You have trouble breathing. Summary  Hypertension is when the force of blood pumping through your arteries is too strong. If this condition is not controlled, it may put you at risk for serious complications.  Your personal target blood pressure may vary depending on your medical conditions, your age, and other factors. For most people, a normal blood pressure is less than 120/80.  Hypertension is managed by lifestyle changes, medicines, or both. Lifestyle changes include weight loss, eating a healthy, low-sodium diet, exercising more, and limiting alcohol. This information is not intended to replace advice given to you by your  health care provider. Make sure you discuss any questions you have with your health care provider. Document Revised: 07/15/2018 Document Reviewed: 02/19/2016 Elsevier Patient Education  2020 [place] Maintenance, Female Adopting a healthy lifestyle and getting preventive care are important in promoting health and wellness. Ask your health care provider about:  The right schedule for you to have regular tests and exams.  Things you can do on your own to prevent diseases and keep yourself healthy. What should I know about diet, weight, and exercise? Eat a  healthy diet   Eat a diet that includes plenty of vegetables, fruits, low-fat dairy products, and lean protein.  Do not eat a lot of foods that are high in solid fats, added sugars, or sodium. Maintain a healthy weight Body mass index (BMI) is used to identify weight problems. It estimates body fat based on height and weight. Your health care provider can help determine your BMI and help you achieve or maintain a healthy weight. Get regular exercise Get regular exercise. This is one of the most important things you can do for your health. Most adults should:  Exercise for at least 150 minutes each week. The exercise should increase your heart rate and make you sweat (moderate-intensity exercise).  Do strengthening exercises at least twice a week. This is in addition to the moderate-intensity exercise.  Spend less time sitting. Even light physical activity can be beneficial. Watch cholesterol and blood lipids Have your blood tested for lipids and cholesterol at 50 years of age, then have this test every 5 years. Have your cholesterol levels checked more often if:  Your lipid or cholesterol levels are high.  You are older than 50 years of age.  You are at high risk for heart disease. What should I know about cancer screening? Depending on your health history and family history, you may need to have cancer screening at various ages. This may include screening for:  Breast cancer.  Cervical cancer.  Colorectal cancer.  Skin cancer.  Lung cancer. What should I know about heart disease, diabetes, and high blood pressure? Blood pressure and heart disease  High blood pressure causes heart disease and increases the risk of stroke. This is more likely to develop in people who have high blood pressure readings, are of African descent, or are overweight.  Have your blood pressure checked: ? Every 3-5 years if you are 23-1 years of age. ? Every year if you are 47 years old or  older. Diabetes Have regular diabetes screenings. This checks your fasting blood sugar level. Have the screening done:  Once every three years after age 32 if you are at a normal weight and have a low risk for diabetes.  More often and at a younger age if you are overweight or have a high risk for diabetes. What should I know about preventing infection? Hepatitis B If you have a higher risk for hepatitis B, you should be screened for this virus. Talk with your health care provider to find out if you are at risk for hepatitis B infection. Hepatitis C Testing is recommended for:  Everyone born from [birth year] through 1966.  Anyone with known risk factors for hepatitis C. Sexually transmitted infections (STIs)  Get screened for STIs, including gonorrhea and chlamydia, if: ? You are sexually active and are younger than 50 years of age. ? You are older than 50 years of age and your health care provider tells you that you are at risk for  this type of infection. ? Your sexual activity has changed since you were last screened, and you are at increased risk for chlamydia or gonorrhea. Ask your health care provider if you are at risk.  Ask your health care provider about whether you are at high risk for HIV. Your health care provider may recommend a prescription medicine to help prevent HIV infection. If you choose to take medicine to prevent HIV, you should first get tested for HIV. You should then be tested every 3 months for as long as you are taking the medicine. Pregnancy  If you are about to stop having your period (premenopausal) and you may become pregnant, seek counseling before you get pregnant.  Take 400 to 800 micrograms (mcg) of folic acid every day if you become pregnant.  Ask for birth control (contraception) if you want to prevent pregnancy. Osteoporosis and menopause Osteoporosis is a disease in which the bones lose minerals and strength with aging. This can result in bone fractures.  If you are 29 years old or older, or if you are at risk for osteoporosis and fractures, ask your health care provider if you should:  Be screened for bone loss.  Take a calcium or vitamin D supplement to lower your risk of fractures.  Be given hormone replacement therapy (HRT) to treat symptoms of menopause. Follow these instructions at home: Lifestyle  Do not use any products that contain nicotine or tobacco, such as cigarettes, e-cigarettes, and chewing tobacco. If you need help quitting, ask your health care provider.  Do not use street drugs.  Do not share needles.  Ask your health care provider for help if you need support or information about quitting drugs. Alcohol use  Do not drink alcohol if: ? Your health care provider tells you not to drink. ? You are pregnant, may be pregnant, or are planning to become pregnant.  If you drink alcohol: ? Limit how much you use to 0-1 drink a day. ? Limit intake if you are breastfeeding.  Be aware of how much alcohol is in your drink. In the [place] one drink equals one 12 oz bottle of beer (355 mL), one 5 oz glass of wine (148 mL), or one 1 oz glass of hard liquor (44 mL). General instructions  Schedule regular health, dental, and eye exams.  Stay current with your vaccines.  Tell your health care provider if: ? You often feel depressed. ? You have ever been abused or do not feel safe at home. Summary  Adopting a healthy lifestyle and getting preventive care are important in promoting health and wellness.  Follow your health care provider's instructions about healthy diet, exercising, and getting tested or screened for diseases.  Follow your health care provider's instructions on monitoring your cholesterol and blood pressure. This information is not intended to replace advice given to you by your health care provider. Make sure you discuss any questions you have with your health care provider. Document Revised: 03/16/2018  Document Reviewed: 03/16/2018 Elsevier Patient Education  [employer].   If you have lab work done today you will be contacted with your lab results within the next 2 weeks.  If you have not heard from [place] then please contact us. The fastest way to get your results is to register for My Chart.   IF you received an x-ray today, you will receive an invoice from [facility]. Please contact [facility] at [phone] with questions or concerns regarding your invoice.  IF you received labwork today, you will receive an invoice from [facility]. Please contact [facility] at [phone] with questions or concerns regarding your invoice.   Our billing staff will not be able to assist you with questions regarding bills from these companies.  You will be contacted with the lab results as soon as they are available. The fastest way to get your results is to activate your My Chart account. Instructions are located on the last page of this paperwork. If you have not heard from [place] regarding the results in 2 weeks, please contact this office.

## 2020-02-01 LAB — COMPREHENSIVE METABOLIC PANEL
ALT: 12 IU/L (ref 0–32)
AST: 17 IU/L (ref 0–40)
Albumin/Globulin Ratio: 1.7 (ref 1.2–2.2)
Albumin: 4.3 g/dL (ref 3.8–4.8)
Alkaline Phosphatase: 57 IU/L (ref 44–121)
BUN/Creatinine Ratio: 16 (ref 9–23)
BUN: 13 mg/dL (ref 6–24)
Bilirubin Total: 0.3 mg/dL (ref 0.0–1.2)
CO2: 23 mmol/L (ref 20–29)
Calcium: 10.4 mg/dL — ABNORMAL HIGH (ref 8.7–10.2)
Chloride: 105 mmol/L (ref 96–106)
Creatinine, Ser: 0.8 mg/dL (ref 0.57–1.00)
GFR calc Af Amer: 100 mL/min/{1.73_m2} (ref >59)
GFR calc non Af Amer: 87 mL/min/{1.73_m2} (ref >59)
Globulin, Total: 2.5 g/dL (ref 1.5–4.5)
Glucose: 93 mg/dL (ref 65–99)
Potassium: 3.8 mmol/L (ref 3.5–5.2)
Sodium: 141 mmol/L (ref 134–144)
Total Protein: 6.8 g/dL (ref 6.0–8.5)

## 2020-02-01 LAB — CBC
Hematocrit: 41.1 % (ref 34.0–46.6)
Hemoglobin: 13.6 g/dL (ref 11.1–15.9)
MCH: 32.2 pg (ref 26.6–33.0)
MCHC: 33.1 g/dL (ref 31.5–35.7)
MCV: 97 fL (ref 79–97)
Platelets: 262 10*3/uL (ref 150–450)
RBC: 4.22 x10E6/uL (ref 3.77–5.28)
RDW: 11.9 % (ref 11.7–15.4)
WBC: 5.5 10*3/uL (ref 3.4–10.8)

## 2020-02-01 LAB — LIPID PANEL
Chol/HDL Ratio: 3.3 ratio (ref 0.0–4.4)
Cholesterol, Total: 215 mg/dL — ABNORMAL HIGH (ref 100–199)
HDL: 66 mg/dL (ref >39)
LDL Chol Calc (NIH): 135 mg/dL — ABNORMAL HIGH (ref 0–99)
Triglycerides: 82 mg/dL (ref 0–149)
VLDL Cholesterol Cal: 14 mg/dL (ref 5–40)

## 2020-02-01 LAB — TSH: TSH: 0.929 u[IU]/mL (ref 0.450–4.500)

## 2020-02-01 LAB — VITAMIN D 25 HYDROXY (VIT D DEFICIENCY, FRACTURES): Vit D, 25-Hydroxy: 18.8 ng/mL — ABNORMAL LOW (ref 30.0–100.0)

## 2020-02-05 ENCOUNTER — Other Ambulatory Visit: Payer: Self-pay | Admitting: Family Medicine

## 2020-02-05 DIAGNOSIS — Z1231 Encounter for screening mammogram for malignant neoplasm of breast: Secondary | ICD-10-CM

## 2020-02-06 ENCOUNTER — Encounter: Payer: Self-pay | Admitting: Family Medicine

## 2020-02-20 ENCOUNTER — Ambulatory Visit
Admission: RE | Admit: 2020-02-20 | Discharge: 2020-02-20 | Disposition: A | Discharge status: Home / Self Care | Payer: 59 | Source: Ambulatory Visit | Attending: Family Medicine | Admitting: Family Medicine

## 2020-02-20 ENCOUNTER — Other Ambulatory Visit: Payer: Self-pay

## 2020-02-20 DIAGNOSIS — Z1231 Encounter for screening mammogram for malignant neoplasm of breast: Secondary | ICD-10-CM

## 2020-03-04 ENCOUNTER — Other Ambulatory Visit: Payer: Self-pay

## 2020-03-04 ENCOUNTER — Ambulatory Visit: Payer: 59 | Admitting: Family Medicine

## 2020-03-04 ENCOUNTER — Encounter: Payer: Self-pay | Admitting: Family Medicine

## 2020-03-04 VITALS — BP 141/92 | HR 76 | Temp 98.2°F | Ht 62.0 in | Wt 151.0 lb

## 2020-03-04 DIAGNOSIS — I10 Essential (primary) hypertension: Secondary | ICD-10-CM | POA: Diagnosis not present

## 2020-03-04 DIAGNOSIS — E7849 Other hyperlipidemia: Secondary | ICD-10-CM | POA: Diagnosis not present

## 2020-03-04 DIAGNOSIS — E559 Vitamin D deficiency, unspecified: Secondary | ICD-10-CM | POA: Diagnosis not present

## 2020-03-04 DIAGNOSIS — G4700 Insomnia, unspecified: Secondary | ICD-10-CM

## 2020-03-04 MED ORDER — ROSUVASTATIN CALCIUM 10 MG PO TABS: 10.0000 mg | ORAL_TABLET | Freq: Every day | ORAL | 3 refills | Status: DC

## 2020-03-04 MED ORDER — OLANZAPINE 5 MG PO TABS: 5.0000 mg | ORAL_TABLET | Freq: Every day | ORAL | 3 refills | Status: DC

## 2020-03-04 MED ORDER — ENALAPRIL MALEATE 20 MG PO TABS: 40.0000 mg | ORAL_TABLET | Freq: Every day | ORAL | 3 refills | Status: DC

## 2020-03-04 MED ORDER — VITAMIN D3 50 MCG (2000 UT) PO TABS: 1.0000 | ORAL_TABLET | Freq: Once | ORAL | 3 refills | Status: AC

## 2020-03-04 NOTE — Patient Instructions (Addendum)
Increase the enalapril to 40mg  daily Continue to take BP daily with goal <140/90 Start crestor daily for your cholesterol  Hypertension, Adult Hypertension is another name for high blood pressure. High blood pressure forces your heart to work harder to pump blood. This can cause problems over time. There are two numbers in a blood pressure reading. There is a top number (systolic) over a bottom number (diastolic). It is best to have a blood pressure that is below 120/80. Healthy choices can help lower your blood pressure, or you may need medicine to help lower it. What are the causes? The cause of this condition is not known. Some conditions may be related to high blood pressure. What increases the risk?  Smoking.  Having type 2 diabetes mellitus, high cholesterol, or both.  Not getting enough exercise or physical activity.  Being overweight.  Having too much fat, sugar, calories, or salt (sodium) in your diet.  Drinking too much alcohol.  Having long-term (chronic) kidney disease.  Having a family history of high blood pressure.  Age. Risk increases with age.  Race. You may be at higher risk if you are African American.  Gender. Men are at higher risk than women before age 55. After age 67, women are at higher risk than men.  Having obstructive sleep apnea.  Stress. What are the signs or symptoms?  High blood pressure may not cause symptoms. Very high blood pressure (hypertensive crisis) may cause: ? Headache. ? Feelings of worry or nervousness (anxiety). ? Shortness of breath. ? Nosebleed. ? A feeling of being sick to your stomach (nausea). ? Throwing up (vomiting). ? Changes in how you see. ? Very bad chest pain. ? Seizures. How is this treated?  This condition is treated by making healthy lifestyle changes, such as: ? Eating healthy foods. ? Exercising more. ? Drinking less alcohol.  Your health care provider may prescribe medicine if lifestyle changes are  not enough to get your blood pressure under control, and if: ? Your top number is above 130. ? Your bottom number is above 80.  Your personal target blood pressure may vary. Follow these instructions at home: Eating and drinking   If told, follow the DASH eating plan. To follow this plan: ? Fill one half of your plate at each meal with fruits and vegetables. ? Fill one fourth of your plate at each meal with whole grains. Whole grains include whole-wheat pasta, brown rice, and whole-grain bread. ? Eat or drink low-fat dairy products, such as skim milk or low-fat yogurt. ? Fill one fourth of your plate at each meal with low-fat (lean) proteins. Low-fat proteins include fish, chicken without skin, eggs, beans, and tofu. ? Avoid fatty meat, cured and processed meat, or chicken with skin. ? Avoid pre-made or processed food.  Eat less than 1,500 mg of salt each day.  Do not drink alcohol if: ? Your doctor tells you not to drink. ? You are pregnant, may be pregnant, or are planning to become pregnant.  If you drink alcohol: ? Limit how much you use to:  0-1 drink a day for women.  0-2 drinks a day for men. ? Be aware of how much alcohol is in your drink. In the [place] one drink equals one 12 oz bottle of beer (355 mL), one 5 oz glass of wine (148 mL), or one 1 oz glass of hard liquor (44 mL). Lifestyle   Work with your doctor to stay at a healthy weight or to  lose weight. Ask your doctor what the best weight is for you.  Get at least 30 minutes of exercise most days of the week. This may include walking, swimming, or biking.  Get at least 30 minutes of exercise that strengthens your muscles (resistance exercise) at least 3 days a week. This may include lifting weights or doing Pilates.  Do not use any products that contain nicotine or tobacco, such as cigarettes, e-cigarettes, and chewing tobacco. If you need help quitting, ask your doctor.  Check your blood pressure at home as told  by your doctor.  Keep all follow-up visits as told by your doctor. This is important. Medicines  Take over-the-counter and prescription medicines only as told by your doctor. Follow directions carefully.  Do not skip doses of blood pressure medicine. The medicine does not work as well if you skip doses. Skipping doses also puts you at risk for problems.  Ask your doctor about side effects or reactions to medicines that you should watch for. Contact a doctor if you:  Think you are having a reaction to the medicine you are taking.  Have headaches that keep coming back (recurring).  Feel dizzy.  Have swelling in your ankles.  Have trouble with your vision. Get help right away if you:  Get a very bad headache.  Start to feel mixed up (confused).  Feel weak or numb.  Feel faint.  Have very bad pain in your: ? Chest. ? Belly (abdomen).  Throw up more than once.  Have trouble breathing. Summary  Hypertension is another name for high blood pressure.  High blood pressure forces your heart to work harder to pump blood.  For most people, a normal blood pressure is less than 120/80.  Making healthy choices can help lower blood pressure. If your blood pressure does not get lower with healthy choices, you may need to take medicine. This information is not intended to replace advice given to you by your health care provider. Make sure you discuss any questions you have with your health care provider. Document Revised: 12/01/2017 Document Reviewed: 12/01/2017 Elsevier Patient Education  [employer].   If you have lab work done today you will be contacted with your lab results within the next 2 weeks.  If you have not heard from [place] then please contact us. The fastest way to get your results is to register for My Chart.   IF you received an x-ray today, you will receive an invoice from [facility]. Please contact [facility] at [phone] with questions  or concerns regarding your invoice.   IF you received labwork today, you will receive an invoice from [place]. Please contact [facility] at [phone] with questions or concerns regarding your invoice.   Our billing staff will not be able to assist you with questions regarding bills from these companies.  You will be contacted with the lab results as soon as they are available. The fastest way to get your results is to activate your My Chart account. Instructions are located on the last page of this paperwork. If you have not heard from [place] regarding the results in 2 weeks, please contact this office.

## 2020-03-04 NOTE — Progress Notes (Signed)
11/29/20219:18 AM  Andrea Palmer Andrea Palmer Oct 10, 1970, 49 y.o., female [id number]  Chief Complaint  Patient presents with  . Hypertension    follow up , med refill     HPI:   Patient is a 49 y.o. female with past medical history significant for HTN, GAD, colonic polyps, rectal carcinoidwho presents today for HTN followup.  Patient Care Team: Tanikka Bresnan, Laurita Quint, FNP as PCP - General (Family Medicine) Crawford Givens, MD as Consulting Physician (Obstetrics and Gynecology)  HTN 01/31/20: Increased enalapril to 30mg  from 20mg  (Max 40) Metoprolol 50mg  daily  BP high the first week In the mornings systolic 681L In the first week was in the 170's Denies headaches and dizziness when BP was elevated   The 10-year ASCVD risk score Mikey Bussing DC Brooke Bonito., et al., 2013) is: 3.9%   Values used to calculate the score:     Age: 5 years     Sex: Female     Is Non-Hispanic African American: Yes     Diabetic: No     Tobacco smoker: No     Systolic Blood Pressure: 572 mmHg     Is BP treated: Yes     HDL Cholesterol: 66 mg/dL     Total Cholesterol: 215 mg/dL   Has started taking Vitamin D She has noticed her generalized aches have improved.   Screenings Mammogram: [facility] 1: 02/20/20 Colon Cancer: Colonoscopy [facility] 3 months ago, had a small tumor and they went back in and removed it. I recommend in 1 to 2 months that you have a blood test known as chromogranin A performed. I recommend in 6 months that you undergo a CT abdomen/pelvis to follow-up the resection. I recommend that you have a repeat lower endoscopic ultrasound in 1 year. PaP: Last October   Depression screen [facility] 2/9 03/04/2020 01/31/2020 10/31/2019  Decreased Interest 0 0 0  Down, Depressed, Hopeless 0 0 0  PHQ - 2 Score 0 0 0    Fall Risk  03/04/2020 01/31/2020 10/31/2019 10/03/2019 08/04/2019  Falls in the past year? 0 0 0 0 0  Number falls in past yr: 0 0 0 0 0  Injury with Fall? 0 0 0 0 0  Follow up Falls evaluation completed  Falls evaluation completed Falls evaluation completed Falls evaluation completed Falls evaluation completed     No Known Allergies  Prior to Admission medications   Medication Sig Start Date End Date Taking? Authorizing Provider  b complex vitamins tablet Take 1 tablet by mouth daily.    [provider]  cholecalciferol (VITAMIN D3) 25 MCG (1000 UNIT) tablet Take 1,000 Units by mouth daily.    [provider]  Coenzyme Q10 (COQ10) 100 MG CAPS Take 100 mg by mouth daily. Patient not taking: Reported on 01/31/2020    [provider]  enalapril (VASOTEC) 20 MG tablet Take 1.5 tablets (30 mg total) by mouth daily. 01/31/20   Dameisha Tschida, Laurita Quint, FNP  metoprolol succinate (TOPROL-XL) 50 MG 24 hr tablet Take 1 tablet (50 mg total) by mouth at bedtime. 12/01/19   Rutherford Guys, MD  OLANZapine (ZYPREXA) 5 MG tablet Take 1 tablet (5 mg total) by mouth at bedtime. 10/03/19   Rutherford Guys, MD    Past Medical History:  Diagnosis Date  . Anxiety   . HTN (hypertension)     Past Surgical History:  Procedure Laterality Date  . COLONOSCOPY    . ENDOSCOPIC MUCOSAL RESECTION N/A 10/23/2019   Procedure: ENDOSCOPIC MUCOSAL RESECTION;  Surgeon: Irving Copas., MD;  Location: [facility];  Service: Gastroenterology;  Laterality: N/A;  . EUS N/A 10/23/2019   Procedure: LOWER ENDOSCOPIC ULTRASOUND (EUS);  Surgeon: Irving Copas., MD;  Location: [facility];  Service: Gastroenterology;  Laterality: N/A;  . FLEXIBLE SIGMOIDOSCOPY N/A 10/23/2019   Procedure: FLEXIBLE SIGMOIDOSCOPY;  Surgeon: Rush Landmark Telford Nab., MD;  Location: [facility];  Service: Gastroenterology;  Laterality: N/A;  . HEMOSTASIS CLIP PLACEMENT  10/23/2019   Procedure: HEMOSTASIS CLIP PLACEMENT;  Surgeon: Irving Copas., MD;  Location: [facility];  Service: Gastroenterology;;  . Lia Foyer LIFTING INJECTION  10/23/2019   Procedure: SUBMUCOSAL LIFTING INJECTION;  Surgeon: Irving Copas., MD;  Location: [facility];  Service: Gastroenterology;;  . TUBAL LIGATION      Social History   Tobacco Use  . Smoking status: Never Smoker  . Smokeless tobacco: Never Used  Substance Use Topics  . Alcohol use: No    Alcohol/week: 0.0 standard drinks    Family History  Problem Relation Age of Onset  . Congestive Heart Failure Mother   . Hypertension Sister   . Colon cancer Neg Hx   . Esophageal cancer Neg Hx   . Rectal cancer Neg Hx   . Stomach cancer Neg Hx     Review of Systems  Constitutional: Negative for chills, fever and malaise/fatigue.  Eyes: Negative for blurred vision and double vision.  Respiratory: Negative for cough, shortness of breath and wheezing.   Cardiovascular: Negative for chest pain, palpitations and leg swelling.  Gastrointestinal: Negative for abdominal pain, blood in stool, constipation, diarrhea, heartburn, nausea and vomiting.  Musculoskeletal: Negative for back pain, joint pain and myalgias.  Skin: Negative for rash.  Neurological: Negative for dizziness, tingling, weakness and headaches.  Psychiatric/Behavioral: The patient is not nervous/anxious and does not have insomnia.      OBJECTIVE:  Today's Vitals   03/04/20 0848  BP: (!) 141/92  Pulse: 76  Temp: 98.2 F (36.8 C)  SpO2: 98%  Weight: 151 lb (68.5 kg)  Height: 5\' 2"  (1.575 m)   Body mass index is 27.62 kg/m.   Physical Exam Constitutional:      General: She is not in acute distress.    Appearance: Normal appearance. She is not ill-appearing.  HENT:     Head: Normocephalic.  Cardiovascular:     Rate and Rhythm: Normal rate and regular rhythm.     Pulses: Normal pulses.     Heart sounds: Normal heart sounds. No murmur heard.  No friction rub. No gallop.   Pulmonary:     Effort: Pulmonary effort is normal. No respiratory distress.     Breath sounds: Normal breath sounds. No stridor. No wheezing, rhonchi or rales.  Abdominal:     General: Bowel sounds  are normal.     Palpations: Abdomen is soft.     Tenderness: There is no abdominal tenderness.  Musculoskeletal:     Right lower leg: No edema.     Left lower leg: No edema.  Skin:    General: Skin is warm and dry.  Neurological:     Mental Status: She is alert and oriented to person, place, and time.  Psychiatric:        Mood and Affect: Mood normal.        Behavior: Behavior normal.     No results found for this or any previous visit (from the past 24 hour(s)).  No results found.   ASSESSMENT and PLAN  Problem List Items  Addressed This Visit      Cardiovascular and Mediastinum   Essential hypertension - Primary (Chronic)   Relevant Medications   enalapril (VASOTEC) 20 MG tablet: increased from 30mg  to 40mg  Discussed this is now at max dose and if BP does not improve would need to add another medication BP goal< 140/90 Continues to take BP at home daily BP Readings from Last 3 Encounters:  03/04/20 (!) 141/92  01/31/20 (!) 160/87  12/01/19 (!) 140/94     rosuvastatin (CRESTOR) 10 MG tablet Discussed r/se/b    Other Visit Diagnoses    Other hyperlipidemia       Relevant Medications   enalapril (VASOTEC) 20 MG tablet   rosuvastatin (CRESTOR) 10 MG tablet   Vitamin D deficiency       Relevant Medications   Cholecalciferol (VITAMIN D3) 50 MCG (2000 UT) TABS Continue daily, will follow up at next appointment   Insomnia, unspecified type       Relevant Medications   OLANZapine (ZYPREXA) 5 MG tablet Discussed r/se/b refills sent       Return in about 3 months (around 06/03/2020) for HTN follow up.    Huston Foley Miquan Tandon, FNP-BC Primary [facility] at [facility] [place], [place] [address] Ph.  [phone] Fax [phone]

## 2020-03-06 ENCOUNTER — Other Ambulatory Visit: Payer: Self-pay

## 2020-03-06 ENCOUNTER — Other Ambulatory Visit: Payer: Self-pay | Admitting: Family Medicine

## 2020-03-06 DIAGNOSIS — I10 Essential (primary) hypertension: Secondary | ICD-10-CM

## 2020-03-06 MED ORDER — METOPROLOL SUCCINATE ER 50 MG PO TB24: 50.0000 mg | ORAL_TABLET | Freq: Every day | ORAL | 3 refills | Status: DC

## 2020-03-06 MED ORDER — METOPROLOL SUCCINATE ER 50 MG PO TB24: 50.0000 mg | ORAL_TABLET | Freq: Every day | ORAL | 1 refills | Status: DC

## 2020-04-24 ENCOUNTER — Telehealth: Payer: Self-pay

## 2020-04-24 DIAGNOSIS — D3A026 Benign carcinoid tumor of the rectum: Secondary | ICD-10-CM

## 2020-04-24 DIAGNOSIS — K6289 Other specified diseases of anus and rectum: Secondary | ICD-10-CM

## 2020-04-24 NOTE — Telephone Encounter (Signed)
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    Left message on machine to call back

## 2020-04-24 NOTE — Telephone Encounter (Signed)
-----   Message from Timothy Lasso, RN sent at 04/12/2020  8:35 AM EST -----  ----- Message ----- From: Timothy Lasso, RN Sent: 04/12/2020  12:00 AM EST To: Timothy Lasso, RN   ----- Message ----- From: Timothy Lasso, RN Sent: 04/06/2020  12:00 AM EST To: Timothy Lasso, RN  I recommend in 6 months  that you undergo a CT abdomen/pelvis to follow-up the resection.

## 2020-04-26 NOTE — Telephone Encounter (Signed)
The pt returned call and is ok to go ahead and set up CT scan. She has asked that I send her the information to My Chart   CT scan set up for 05/06/20 at 730 am to arrive at 715 am at [facility] radiology.  She will need to pick up contrast at [facility] radiology at least 2 days prior.  Will send this information to the pt via My Chart.

## 2020-04-26 NOTE — Telephone Encounter (Signed)
Left message on machine to call back  

## 2020-05-06 ENCOUNTER — Ambulatory Visit (HOSPITAL_COMMUNITY)
Admission: RE | Admit: 2020-05-06 | Discharge: 2020-05-06 | Disposition: A | Discharge status: Home / Self Care | Payer: 59 | Source: Ambulatory Visit | Attending: Gastroenterology | Admitting: Gastroenterology

## 2020-05-06 ENCOUNTER — Other Ambulatory Visit: Payer: Self-pay

## 2020-05-06 ENCOUNTER — Encounter (HOSPITAL_COMMUNITY): Payer: Self-pay

## 2020-05-06 DIAGNOSIS — K6289 Other specified diseases of anus and rectum: Secondary | ICD-10-CM | POA: Diagnosis not present

## 2020-05-06 DIAGNOSIS — D3A026 Benign carcinoid tumor of the rectum: Secondary | ICD-10-CM | POA: Diagnosis present

## 2020-05-06 LAB — POCT I-STAT CREATININE: Creatinine, Ser: 0.7 mg/dL (ref 0.44–1.00)

## 2020-05-06 MED ORDER — IOHEXOL 9 MG/ML PO SOLN
1000.0000 mL | ORAL | Status: AC
  Administered 2020-05-06: 1000 mL via ORAL

## 2020-05-06 MED ORDER — IOHEXOL 300 MG/ML  SOLN
100.0000 mL | Freq: Once | INTRAMUSCULAR | Status: AC | PRN
  Administered 2020-05-06: 100 mL via INTRAVENOUS

## 2020-05-06 MED ORDER — IOHEXOL 9 MG/ML PO SOLN
ORAL | Status: AC
  Filled 2020-05-06: qty 1000

## 2020-05-21 ENCOUNTER — Other Ambulatory Visit: Payer: Self-pay

## 2020-05-21 ENCOUNTER — Encounter: Payer: Self-pay | Admitting: Gastroenterology

## 2020-05-21 ENCOUNTER — Ambulatory Visit (INDEPENDENT_AMBULATORY_CARE_PROVIDER_SITE_OTHER): Payer: 59 | Admitting: Gastroenterology

## 2020-05-21 VITALS — BP 160/104 | HR 80 | Ht 62.0 in | Wt 156.2 lb

## 2020-05-21 DIAGNOSIS — R932 Abnormal findings on diagnostic imaging of liver and biliary tract: Secondary | ICD-10-CM | POA: Diagnosis not present

## 2020-05-21 DIAGNOSIS — D3A8 Other benign neuroendocrine tumors: Secondary | ICD-10-CM

## 2020-05-21 DIAGNOSIS — K5900 Constipation, unspecified: Secondary | ICD-10-CM | POA: Diagnosis not present

## 2020-05-21 MED ORDER — LUBIPROSTONE 8 MCG PO CAPS: 8.0000 ug | ORAL_CAPSULE | Freq: Two times a day (BID) | ORAL | 1 refills | Status: DC

## 2020-05-21 NOTE — Progress Notes (Signed)
Following message sent to Rhys Martini and April Pait:  [facility] Phone: [phone] Fax: [phone]   Patient Name: Andrea Palmer DOB: 02-22-1971 MRN #: [id number]  Imaging Ordered: MRI Abd  Diagnosis: Abd pain and hepatic lesions  Ordering Provider: Dr. Tarri Glenn  Is a Prior Authorization needed? We are in the process of obtaining it now  Is the patient Diabetic? No  Does the patient have Hypertension? No  Does the patient have any implanted devices or hardware? No  Patient Weight? 156#  Is the patient able to get on the table? Yes  Has the patient been diagnosed with COVID? No  Is the patient waiting on COVID testing results? No  Thank you for your assistance! [facility]

## 2020-05-21 NOTE — Patient Instructions (Addendum)
RECOMMENDATION(S):    Continue high fiber diet, drink at least 64 ounces of water, continue to use flax seed  I am thankful that you had such good results from the resection of the neuroendocrine tumor with Dr. Rush Landmark. He would like to relook at the area in your rectum in July 2022 to be sure that there is no residual tumor.  FOLLOW UP TO YOUR CT RESULTS:   Your recent CT scan showed some small lymph nodes. These are really too small to be considered significant. But, we should follow them closely over time.   Your CT also showed some small spots in the your liver. These are likely cysts, but, since you haven't had prior pictures of your liver, and with the recent neuroendocrine diagnosis, we should plan for an MRI for further evaluation.  IMAGING:  . You will be contacted by [facility] (Your caller ID will indicate phone # [phone]) in the next 2 days to schedule your MRI ABDOMEN. If you have not heard from them within 2 business days, please call [facility] at [phone] to follow up on the status of your appointment.     The CT also showed changes of atherosclerosis. Please review these findings with Huston Foley Just to determine if more evaluation or treatment might be needed.   TRIAL OF AMITIZA:   I have recommended a trial Amitiza 8 mcg twice daily given your constipation. We can adjust this dose as needed based on your response. The most frequent side effect is diarrhea.   PRESCRIPTION MEDICATION(S):   We have sent the following medication(s) to your pharmacy:  . Amitiza - Please take 45mcg by mouth 2 times daily. You may titrate the dose as needed.   NOTE - We have provided you with a 30 day supply with 1 refill. If you change your dosage, please call the office to make [place] aware so a new prescription can be sent to your pharmacy.  ADDITIONALLY: If your medication(s) requires a PRIOR AUTHORIZATION, we will receive notification  from your pharmacy. Once received, the process to submit for approval may take up to 7-10 business days. You will be contacted about any denials we have received from your insurance company as well as alternatives recommended by your provider.  BMI:  If you are age 23 or younger, your body mass index should be between 19-25. Your Body mass index is 28.58 kg/m. If this is out of the aformentioned range listed, please consider follow up with your Primary Care Provider.   Thank you for trusting me with your gastrointestinal care!    Thornton Park, MD, MPH

## 2020-05-21 NOTE — Progress Notes (Signed)
Referring Provider: Just, Laurita Quint, FNP Primary Care Physician:  Just, Laurita Quint, FNP  Chief complain:  Abdominal pain   IMPRESSION:  Multiple low-attenuation hepatic lesions on CT Chronic constipation with ? Associated pelvic floor dyssfunction    - normal TSH and calcium    - improved with dietary changes Well-differentiated, low grade rectal neuroendocrine tumor    - resected 7/21    - normal CT  Abnormal lesions in the liver - ? cysts Tubular adenoma on colonoscopy 5/21 No known family history of colon cancer or polyps  Abnormal liver on CT: MRI recommended for further evaluation. Although metastatic neuroendocrine tumor is unlikely, it must be considered.   Rectal neuroendocrine tumor: Due surveillance EUS 7/22 with Dr. Rush Landmark.   Chronic constipation: Discussed lifestyle manifestations. Will add Amitiza 66mg BID.  Discussed anorectal manometry + pelvic floor PT/biofeedback. She may call to schedule at any time.   History of colon polyps: Surveillance colonoscopy due 2028, however, may want to consider earlier.   PLAN: - Continue high fiber diet, drink at least 64 ounces of water, continue to use flax seed - Trial of Amitiza 817m BID, titrating the dose as needed - MRI of the liver to evaluate the abnormal CT scan - Follow-up with KeHuston Foleyust re: Atherosclerosis seen on recent CT scan - Return to this office in 3-4 months, earlier if needed   Please see the "Patient Instructions" section for addition details about the plan.  HPI: Andrea LASOTAs a [age].o. female who returns in follow-up for abdominal pain. She has hypertension and anxiety.  She is a haTheme park manager She had a colonoscopy 08/31/19.   Initially seen in consultation 08/24/19 for a history of internal left lower quadrant sore/abdominal pain last year that improved with defecation in the setting of a lifetime history of constipation and chronic bloating. Using flax seed gel oils with complete relief of  symptoms at the time of her consultation, with addition of Dulocolax if no BM after 3 days. Intermittent sense of incomplete evacuation. No manual assistance with defecation. No blood or mucous.   Colonoscopy 08/31/2019 showed a 1 mm ascending colon tubular adenoma, a 4 mm submucosal rectal well-differentiated neuroendocrine tumor, and internal hemorrhoids. Ki-67 was <1%.   Endoscopic ultrasound was performed by Dr. MaRush Landmark/19/2021 revealing a 6 mm submucosal nodule in the distal rectum.  EUS showed local wall thickening.   Labs 01/31/2020 showed a normal comprehensive metabolic panel except for a calcium of 10.4.  Liver enzymes were normal.  CT of the abdomen and pelvis 05/06/2020 showed multiple scattered pelvic lymph nodes less than 5 mm, scattered low-attenuation hepatic lesions that are difficult to characterize but are thought to be benign cysts, and no evidence of rectal mass.  There are age advanced atherosclerotic calcifications.  Surveillance endoscopic ultrasound recommended in 1 year.  She is overall feeling well. Returns today to discuss her CT scan results. GI ROS is essentially negative.  No prior abdominal imaging. No prior knowledge of liver abnormalities. No family history of liver disease.    Past Medical History:  Diagnosis Date  . Anxiety   . HTN (hypertension)     Past Surgical History:  Procedure Laterality Date  . COLONOSCOPY    . ENDOSCOPIC MUCOSAL RESECTION N/A 10/23/2019   Procedure: ENDOSCOPIC MUCOSAL RESECTION;  Surgeon: MaRush LandmarkaTelford Nab MD;  Location: [facility] Service: Gastroenterology;  Laterality: N/A;  . EUS N/A 10/23/2019   Procedure: LOWER ENDOSCOPIC ULTRASOUND (EUS);  Surgeon:  Mansouraty, Telford Nab., MD;  Location: [facility];  Service: Gastroenterology;  Laterality: N/A;  . FLEXIBLE SIGMOIDOSCOPY N/A 10/23/2019   Procedure: FLEXIBLE SIGMOIDOSCOPY;  Surgeon: Rush Landmark Telford Nab., MD;  Location: [facility];  Service:  Gastroenterology;  Laterality: N/A;  . HEMOSTASIS CLIP PLACEMENT  10/23/2019   Procedure: HEMOSTASIS CLIP PLACEMENT;  Surgeon: Irving Copas., MD;  Location: [facility];  Service: Gastroenterology;;  . Lia Foyer LIFTING INJECTION  10/23/2019   Procedure: SUBMUCOSAL LIFTING INJECTION;  Surgeon: Irving Copas., MD;  Location: [facility];  Service: Gastroenterology;;  . TUBAL LIGATION      Current Outpatient Medications  Medication Sig Dispense Refill  . b complex vitamins tablet Take 1 tablet by mouth daily.    . Coenzyme Q10 (COQ10) 100 MG CAPS Take 100 mg by mouth daily.     . enalapril (VASOTEC) 20 MG tablet Take 2 tablets (40 mg total) by mouth daily. 180 tablet 3  . metoprolol succinate (TOPROL-XL) 50 MG 24 hr tablet Take 1 tablet (50 mg total) by mouth at bedtime. 90 tablet 1  . OLANZapine (ZYPREXA) 5 MG tablet Take 1 tablet (5 mg total) by mouth at bedtime. 90 tablet 3  . rosuvastatin (CRESTOR) 10 MG tablet Take 10 mg by mouth daily.     No current facility-administered medications for this visit.    Allergies as of 05/21/2020  . (No Known Allergies)    Family History  Problem Relation Age of Onset  . Congestive Heart Failure Mother   . Hypertension Sister   . Colon cancer Neg Hx   . Esophageal cancer Neg Hx   . Rectal cancer Neg Hx   . Stomach cancer Neg Hx     Social History   Socioeconomic History  . Marital status: Single    Spouse name: Not on file  . Number of children: 2  . Years of education: 42  . Highest education level: Not on file  Occupational History  . Occupation: Hair stylist   Tobacco Use  . Smoking status: Never Smoker  . Smokeless tobacco: Never Used  Vaping Use  . Vaping Use: Never used  Substance and Sexual Activity  . Alcohol use: No    Alcohol/week: 0.0 standard drinks  . Drug use: No  . Sexual activity: Yes    Partners: Male    Birth control/protection: None  Other Topics Concern  . Not on file  Social History  Narrative   Born and raised in [place]. Currently resides in an apartment with her son. Has a rabbit. Fun: watch tv (law and order; criminal minds); Denies any religious beliefs that would effect health care.   Social Determinants of Health   Financial Resource Strain: Not on file  Food Insecurity: Not on file  Transportation Needs: Not on file  Physical Activity: Not on file  Stress: Not on file  Social Connections: Not on file  Intimate Partner Violence: Not on file     Physical Exam: General:   Alert,  well-nourished, pleasant and cooperative in NAD Head:  Normocephalic and atraumatic. Eyes:  Sclera clear, no icterus.   Conjunctiva pink. Abdomen:  Soft, nontender, nondistended, normal bowel sounds, no rebound or guarding. No hepatosplenomegaly.   Rectal:  Deferred  Msk:  Symmetrical. No boney deformities LAD: No inguinal or umbilical LAD Extremities:  No clubbing or edema. Neurologic:  Alert and  oriented x4;  grossly nonfocal Skin:  Intact without significant lesions or rashes. Psych:  Alert and cooperative. Normal mood and affect.  Nari Vannatter L. Tarri Glenn, MD, MPH 05/21/2020, 9:52 AM

## 2020-05-27 NOTE — Progress Notes (Signed)
Appointment Information  Name: Andrea Palmer, Andrea Palmer MRN: [id number]  Date: 07/01/2020 Status: Sch  Time: 10:00 AM Length: 60  Visit Type: MR ABDOMEN [facility] [[id number]] Copay: $0.00  Provider: [facility] 1 Department: [facility]  Referring Provider: Thornton Park CSN: [id number]  Notes: PT To ARRIVE @ 930am [facility] Location/ NPO/ Spk w PT   Made On: 05/24/2020 6:08 PM By: Valli Glance

## 2020-06-04 ENCOUNTER — Encounter: Payer: Self-pay | Admitting: Family Medicine

## 2020-06-04 ENCOUNTER — Ambulatory Visit (INDEPENDENT_AMBULATORY_CARE_PROVIDER_SITE_OTHER): Payer: 59 | Admitting: Family Medicine

## 2020-06-04 ENCOUNTER — Other Ambulatory Visit: Payer: Self-pay

## 2020-06-04 VITALS — BP 160/97 | HR 71 | Temp 98.2°F | Ht 62.0 in | Wt 156.0 lb

## 2020-06-04 DIAGNOSIS — E7849 Other hyperlipidemia: Secondary | ICD-10-CM | POA: Diagnosis not present

## 2020-06-04 DIAGNOSIS — E559 Vitamin D deficiency, unspecified: Secondary | ICD-10-CM

## 2020-06-04 DIAGNOSIS — I10 Essential (primary) hypertension: Secondary | ICD-10-CM

## 2020-06-04 MED ORDER — HYDROCHLOROTHIAZIDE 25 MG PO TABS: 25.0000 mg | ORAL_TABLET | Freq: Every day | ORAL | 3 refills | Status: DC

## 2020-06-04 NOTE — Progress Notes (Signed)
3/1/20229:29 AM  Andrea Palmer March 09, 1971, 50 y.o., female [id number]  Chief Complaint  Patient presents with  . Hypertension    Readigns at home range 140 to 150 over 90's     HPI:   Patient is a 50 y.o. female with past medical history significant for HTN, GAD, colonic polyps, rectal carcinoidwho presents today for HTN followup.  Patient Care Team: Virgina Deakins, Laurita Quint, FNP as PCP - General (Family Medicine) Crawford Givens, MD as Consulting Physician (Obstetrics and Gynecology)   Has been sleeping well with zyprexa Denies acute issues at this time   HTN Last OV: Increased enalapril to 16m to 40 Metoprolol 582mdaily In the mornings systolic 14631-497Wenies headaches and dizziness when BP was elevated BP not at goal < 130/80  BP Readings from Last 3 Encounters:  06/04/20 (!) 160/97  05/21/20 (!) 160/104  03/04/20 (!) 141/92     HLD Crestor 10 mg daily Atherosclerosis shown on CT The 10-year ASCVD risk score (GMikey BussingC Jr., et al., 2013) is: 7%   Values used to calculate the score:     Age: [age]     Sex: Female     Is Non-Hispanic African American: Yes     Diabetic: No     Tobacco smoker: No     Systolic Blood Pressure: 16263mHg     Is BP treated: Yes     HDL Cholesterol: 66 mg/dL     Total Cholesterol: 215 mg/dL  Vitamin D Def OTC vitamin D Lab Results  Component Value Date   VD25OH 18.8 (L) 01/31/2020   Uterine fibroids seen on CT  Screenings: sees GI Colon Cancer: Colonoscopy  3 months ago, had a small tumor and they went back in and removed it. I recommend in 1 to 2 months that you have a blood test known as chromogranin A performed. I recommend in 6 months that you undergo a CT abdomen/pelvis to follow-up the resection. I recommend that you have a repeat lower endoscopic ultrasound in 1 year.  Was given Amitiza by GI Takes dulcolax every other day   Health Maintenance  Topic Date Due  . COVID-19 Vaccine (3 - Booster for Pfizer  series) 02/09/2020  . INFLUENZA VACCINE  07/04/2020 (Originally 11/05/2019)  . Hepatitis C Screening  11/30/2020 (Originally 121972-03-14 . PAP SMEAR-Modifier  02/07/2022  . MAMMOGRAM  02/19/2022  . TETANUS/TDAP  04/30/2027  . COLONOSCOPY (Pts [id number] coverage will need to be confirmed)  08/30/2029  . HIV Screening  Completed  . HPV VACCINES  Aged Out     Dep[facility] screen [facility] 06/04/2020 03/04/2020 01/31/2020  Decreased Interest 0 0 0  Down, Depressed, Hopeless 0 0 0  PHQ - 2 Score 0 0 0    Fall Risk  06/04/2020 03/04/2020 01/31/2020 10/31/2019 10/03/2019  Falls in the past year? 0 0 0 0 0  Number falls in past yr: 0 0 0 0 0  Injury with Fall? 0 0 0 0 0  Follow up _0      No Known Allergies  Prior to Admission medications   Medication Sig Start Date End Date Taking? Authorizing Provider  b complex vitamins tablet Take 1 tablet by mouth daily.    [provider]  cholecalciferol (VITAMIN D3) 25 MCG (1000 UNIT) tablet Take 1,000 Units by mouth daily.    [provider]  Coenzyme Q10 (COQ10) 100 MG  CAPS Take 100 mg by mouth daily. Patient not taking: Reported on 01/31/2020    [provider]  enalapril (VASOTEC) 20 MG tablet Take 1.5 tablets (30 mg total) by mouth daily. 01/31/20   Dray Dente, Laurita Quint, FNP  metoprolol succinate (TOPROL-XL) 50 MG 24 hr tablet Take 1 tablet (50 mg total) by mouth at bedtime. 12/01/19   Rutherford Guys, MD  OLANZapine (ZYPREXA) 5 MG tablet Take 1 tablet (5 mg total) by mouth at bedtime. 10/03/19   Rutherford Guys, MD    Past Medical History:  Diagnosis Date  . Anxiety   . HTN (hypertension)     Past Surgical History:  Procedure Laterality Date  . COLONOSCOPY    . ENDOSCOPIC MUCOSAL RESECTION N/A 10/23/2019   Procedure: ENDOSCOPIC MUCOSAL RESECTION;  Surgeon: Rush Landmark Telford Nab.,  MD;  Location: [facility];  Service: Gastroenterology;  Laterality: N/A;  . EUS N/A 10/23/2019   Procedure: LOWER ENDOSCOPIC ULTRASOUND (EUS);  Surgeon: Irving Copas., MD;  Location: [facility];  Service: Gastroenterology;  Laterality: N/A;  . FLEXIBLE SIGMOIDOSCOPY N/A 10/23/2019   Procedure: FLEXIBLE SIGMOIDOSCOPY;  Surgeon: Rush Landmark Telford Nab., MD;  Location: [facility];  Service: Gastroenterology;  Laterality: N/A;  . HEMOSTASIS CLIP PLACEMENT  10/23/2019   Procedure: HEMOSTASIS CLIP PLACEMENT;  Surgeon: Irving Copas., MD;  Location: [facility];  Service: Gastroenterology;;  . Lia Foyer LIFTING INJECTION  10/23/2019   Procedure: SUBMUCOSAL LIFTING INJECTION;  Surgeon: Irving Copas., MD;  Location: [facility];  Service: Gastroenterology;;  . TUBAL LIGATION      Social History   Tobacco Use  . Smoking status: Never Smoker  . Smokeless tobacco: Never Used  Substance Use Topics  . Alcohol use: No    Alcohol/week: 0.0 standard drinks    Family History  Problem Relation Age of Onset  . Congestive Heart Failure Mother   . Hypertension Sister   . Colon cancer Neg Hx   . Esophageal cancer Neg Hx   . Rectal cancer Neg Hx   . Stomach cancer Neg Hx     Review of Systems  Constitutional: Negative for chills, fever and malaise/fatigue.  Eyes: Negative for blurred vision and double vision.  Respiratory: Negative for cough, shortness of breath and wheezing.   Cardiovascular: Negative for chest pain, palpitations and leg swelling.  Gastrointestinal: Positive for constipation (sees GI for this). Negative for abdominal pain, blood in stool, diarrhea, heartburn, nausea and vomiting.  Musculoskeletal: Negative for back pain, joint pain and myalgias.  Skin: Negative for rash.  Neurological: Negative for dizziness, tingling, weakness and headaches.  Psychiatric/Behavioral: The patient is not nervous/anxious and does not have insomnia.       OBJECTIVE:  Today's Vitals   06/04/20 0910  BP: (!) 160/97  Pulse: 71  Temp: 98.2 F (36.8 C)  SpO2: 99%  Weight: 156 lb (70.8 kg)  Height: _0  (1.575 m)   Body mass index is 28.53 kg/m.   Physical Exam Constitutional:      General: She is not in acute distress.    Appearance: Normal appearance. She is not ill-appearing.  HENT:     Head: Normocephalic.  Cardiovascular:     Rate and Rhythm: Normal rate and regular rhythm.     Pulses: Normal pulses.     Heart sounds: Normal heart sounds. No murmur heard. No friction rub. No gallop.   Pulmonary:     Effort: Pulmonary effort is normal. No respiratory distress.     Breath sounds: Normal  breath sounds. No stridor. No wheezing, rhonchi or rales.  Abdominal:     General: Bowel sounds are normal.     Palpations: Abdomen is soft.     Tenderness: There is no abdominal tenderness.  Musculoskeletal:     Right lower leg: No edema.     Left lower leg: No edema.  Skin:    General: Skin is warm and dry.  Neurological:     Mental Status: She is alert and oriented to person, place, and time.  Psychiatric:        Mood and Affect: Mood normal.        Behavior: Behavior normal.     No results found for this or any previous visit (from the past 24 hour(s)).  No results found.   ASSESSMENT and PLAN  Problem List Items Addressed This Visit      Cardiovascular and Mediastinum   Essential hypertension (Chronic)   Relevant Medications   hydrochlorothiazide (HYDRODIURIL) 25 MG tablet   Other Relevant Orders   CMP14+EGFR    Other Visit Diagnoses    Vitamin D deficiency    -  Primary   Relevant Orders   Vitamin D, 25-hydroxy   Other hyperlipidemia       Relevant Medications   hydrochlorothiazide (HYDRODIURIL) 25 MG tablet   Other Relevant Orders   Lipid Panel           Plan  Adding HCTZ to enalapril and metoprolol  Will follow up with lab results  R/se/b discussed  Will follow up in 2 weeks with home  BP   Return in about 3 months (around 09/04/2020).    Huston Foley Marleah Beever, FNP-BC [facility] [place], [place] [address] Ph.  [phone] Fax [phone]

## 2020-06-04 NOTE — Patient Instructions (Addendum)
BP goal < 130/80 Adding HCTZ daily    Hypertension, Adult Hypertension is another name for high blood pressure. High blood pressure forces your heart to work harder to pump blood. This can cause problems over time. There are two numbers in a blood pressure reading. There is a top number (systolic) over a bottom number (diastolic). It is best to have a blood pressure that is below 120/80. Healthy choices can help lower your blood pressure, or you may need medicine to help lower it. What are the causes? The cause of this condition is not known. Some conditions may be related to high blood pressure. What increases the risk?  Smoking.  Having type 2 diabetes mellitus, high cholesterol, or both.  Not getting enough exercise or physical activity.  Being overweight.  Having too much fat, sugar, calories, or salt (sodium) in your diet.  Drinking too much alcohol.  Having long-term (chronic) kidney disease.  Having a family history of high blood pressure.  Age. Risk increases with age.  Race. You may be at higher risk if you are African American.  Gender. Men are at higher risk than women before age 41. After age 12, women are at higher risk than men.  Having obstructive sleep apnea.  Stress. What are the signs or symptoms?  High blood pressure may not cause symptoms. Very high blood pressure (hypertensive crisis) may cause: ? Headache. ? Feelings of worry or nervousness (anxiety). ? Shortness of breath. ? Nosebleed. ? A feeling of being sick to your stomach (nausea). ? Throwing up (vomiting). ? Changes in how you see. ? Very bad chest pain. ? Seizures. How is this treated?  This condition is treated by making healthy lifestyle changes, such as: ? Eating healthy foods. ? Exercising more. ? Drinking less alcohol.  Your health care provider may prescribe medicine if lifestyle changes are not enough to get your blood pressure under control, and if: ? Your top number is  above 130. ? Your bottom number is above 80.  Your personal target blood pressure may vary. Follow these instructions at home: Eating and drinking  If told, follow the DASH eating plan. To follow this plan: ? Fill one half of your plate at each meal with fruits and vegetables. ? Fill one fourth of your plate at each meal with whole grains. Whole grains include whole-wheat pasta, brown rice, and whole-grain bread. ? Eat or drink low-fat dairy products, such as skim milk or low-fat yogurt. ? Fill one fourth of your plate at each meal with low-fat (lean) proteins. Low-fat proteins include fish, chicken without skin, eggs, beans, and tofu. ? Avoid fatty meat, cured and processed meat, or chicken with skin. ? Avoid pre-made or processed food.  Eat less than 1,500 mg of salt each day.  Do not drink alcohol if: ? Your doctor tells you not to drink. ? You are pregnant, may be pregnant, or are planning to become pregnant.  If you drink alcohol: ? Limit how much you use to:  0-1 drink a day for women.  0-2 drinks a day for men. ? Be aware of how much alcohol is in your drink. In the [place] one drink equals one 12 oz bottle of beer (355 mL), one 5 oz glass of wine (148 mL), or one 1 oz glass of hard liquor (44 mL).   Lifestyle  Work with your doctor to stay at a healthy weight or to lose weight. Ask your doctor what the best weight is for  you.  Get at least 30 minutes of exercise most days of the week. This may include walking, swimming, or biking.  Get at least 30 minutes of exercise that strengthens your muscles (resistance exercise) at least 3 days a week. This may include lifting weights or doing Pilates.  Do not use any products that contain nicotine or tobacco, such as cigarettes, e-cigarettes, and chewing tobacco. If you need help quitting, ask your doctor.  Check your blood pressure at home as told by your doctor.  Keep all follow-up visits as told by your doctor. This is  important.   Medicines  Take over-the-counter and prescription medicines only as told by your doctor. Follow directions carefully.  Do not skip doses of blood pressure medicine. The medicine does not work as well if you skip doses. Skipping doses also puts you at risk for problems.  Ask your doctor about side effects or reactions to medicines that you should watch for. Contact a doctor if you:  Think you are having a reaction to the medicine you are taking.  Have headaches that keep coming back (recurring).  Feel dizzy.  Have swelling in your ankles.  Have trouble with your vision. Get help right away if you:  Get a very bad headache.  Start to feel mixed up (confused).  Feel weak or numb.  Feel faint.  Have very bad pain in your: ? Chest. ? Belly (abdomen).  Throw up more than once.  Have trouble breathing. Summary  Hypertension is another name for high blood pressure.  High blood pressure forces your heart to work harder to pump blood.  For most people, a normal blood pressure is less than 120/80.  Making healthy choices can help lower blood pressure. If your blood pressure does not get lower with healthy choices, you may need to take medicine. This information is not intended to replace advice given to you by your health care provider. Make sure you discuss any questions you have with your health care provider. Document Revised: 12/01/2017 Document Reviewed: 12/01/2017 Elsevier Patient Education  2021 [employer].    If you have lab work done today you will be contacted with your lab results within the next 2 weeks.  If you have not heard from [place] then please contact us. The fastest way to get your results is to register for My Chart.   IF you received an x-ray today, you will receive an invoice from [facility]. Please contact [facility] at [phone] with questions or concerns regarding your invoice.   IF you received labwork today,  you will receive an invoice from [facility]. Please contact [facility] at [phone] with questions or concerns regarding your invoice.   Our billing staff will not be able to assist you with questions regarding bills from these companies.  You will be contacted with the lab results as soon as they are available. The fastest way to get your results is to activate your My Chart account. Instructions are located on the last page of this paperwork. If you have not heard from [place] regarding the results in 2 weeks, please contact this office.

## 2020-06-05 LAB — CMP14+EGFR
ALT: 13 IU/L (ref 0–32)
AST: 18 IU/L (ref 0–40)
Albumin/Globulin Ratio: 1.9 (ref 1.2–2.2)
Albumin: 4.4 g/dL (ref 3.8–4.8)
Alkaline Phosphatase: 63 IU/L (ref 44–121)
BUN/Creatinine Ratio: 18 (ref 9–23)
BUN: 15 mg/dL (ref 6–24)
Bilirubin Total: 0.2 mg/dL (ref 0.0–1.2)
CO2: 23 mmol/L (ref 20–29)
Calcium: 10.1 mg/dL (ref 8.7–10.2)
Chloride: 105 mmol/L (ref 96–106)
Creatinine, Ser: 0.82 mg/dL (ref 0.57–1.00)
Globulin, Total: 2.3 g/dL (ref 1.5–4.5)
Glucose: 79 mg/dL (ref 65–99)
Potassium: 4.6 mmol/L (ref 3.5–5.2)
Sodium: 143 mmol/L (ref 134–144)
Total Protein: 6.7 g/dL (ref 6.0–8.5)
eGFR: 87 mL/min/{1.73_m2} (ref >59)

## 2020-06-05 LAB — LIPID PANEL
Chol/HDL Ratio: 2.3 ratio (ref 0.0–4.4)
Cholesterol, Total: 156 mg/dL (ref 100–199)
HDL: 69 mg/dL (ref >39)
LDL Chol Calc (NIH): 75 mg/dL (ref 0–99)
Triglycerides: 59 mg/dL (ref 0–149)
VLDL Cholesterol Cal: 12 mg/dL (ref 5–40)

## 2020-06-05 LAB — VITAMIN D 25 HYDROXY (VIT D DEFICIENCY, FRACTURES): Vit D, 25-Hydroxy: 17.9 ng/mL — ABNORMAL LOW (ref 30.0–100.0)

## 2020-06-24 ENCOUNTER — Other Ambulatory Visit: Payer: Self-pay | Admitting: Gastroenterology

## 2020-06-24 DIAGNOSIS — K5900 Constipation, unspecified: Secondary | ICD-10-CM

## 2020-06-24 DIAGNOSIS — R932 Abnormal findings on diagnostic imaging of liver and biliary tract: Secondary | ICD-10-CM

## 2020-06-24 DIAGNOSIS — D3A8 Other benign neuroendocrine tumors: Secondary | ICD-10-CM

## 2020-06-27 ENCOUNTER — Telehealth: Payer: Self-pay | Admitting: Gastroenterology

## 2020-06-27 NOTE — Telephone Encounter (Signed)
Patient calling to advise she still has not received a call to get the MRI contrast please advise.

## 2020-06-27 NOTE — Telephone Encounter (Signed)
Error

## 2020-06-28 ENCOUNTER — Other Ambulatory Visit: Payer: Self-pay

## 2020-06-28 ENCOUNTER — Telehealth: Payer: Self-pay | Admitting: Family Medicine

## 2020-06-28 DIAGNOSIS — G4700 Insomnia, unspecified: Secondary | ICD-10-CM

## 2020-06-28 DIAGNOSIS — I10 Essential (primary) hypertension: Secondary | ICD-10-CM

## 2020-06-28 MED ORDER — ENALAPRIL MALEATE 20 MG PO TABS: 40.0000 mg | ORAL_TABLET | Freq: Every day | ORAL | 3 refills | Status: DC

## 2020-06-28 MED ORDER — OLANZAPINE 5 MG PO TABS: 5.0000 mg | ORAL_TABLET | Freq: Every day | ORAL | 3 refills | Status: DC

## 2020-06-28 MED ORDER — METOPROLOL SUCCINATE ER 50 MG PO TB24: 50.0000 mg | ORAL_TABLET | Freq: Every day | ORAL | 1 refills | Status: DC

## 2020-06-28 NOTE — Telephone Encounter (Signed)
Called patient and let her know the contrast for MRI of the liver is all IV.

## 2020-06-28 NOTE — Telephone Encounter (Signed)
Sent appropriate refills

## 2020-06-28 NOTE — Telephone Encounter (Signed)
06/28/2020 - PATIENT CAME BY THE OFFICE TO SEE IF KELSEA JUST HAD GONE YET. HER LAST DAY WAS Thursday 06/27/2020. SHE STATED SHE NEEDED REFILLS ON HER MEDICATIONS AND THAT SHE DOES NOT SEE A NEW PROVIDER UNTIL July. I SPOKE WITH FELICIA K. AND SHE WILL SEND THEM ELECTRONICALLY. BEST PHONE FOR PATIENT IS: [phone] (CELL)  PHARMACY CHOICE IS: [facility]. [place]

## 2020-07-01 ENCOUNTER — Ambulatory Visit (HOSPITAL_COMMUNITY)
Admission: RE | Admit: 2020-07-01 | Discharge: 2020-07-01 | Disposition: A | Discharge status: Home / Self Care | Payer: 59 | Source: Ambulatory Visit | Attending: Gastroenterology | Admitting: Gastroenterology

## 2020-07-01 ENCOUNTER — Other Ambulatory Visit: Payer: Self-pay

## 2020-07-01 DIAGNOSIS — D3A8 Other benign neuroendocrine tumors: Secondary | ICD-10-CM | POA: Insufficient documentation

## 2020-07-01 DIAGNOSIS — R932 Abnormal findings on diagnostic imaging of liver and biliary tract: Secondary | ICD-10-CM | POA: Insufficient documentation

## 2020-07-01 DIAGNOSIS — K5900 Constipation, unspecified: Secondary | ICD-10-CM | POA: Insufficient documentation

## 2020-07-01 MED ORDER — GADOBUTROL 1 MMOL/ML IV SOLN
7.0000 mL | Freq: Once | INTRAVENOUS | Status: AC | PRN
  Administered 2020-07-01: 7 mL via INTRAVENOUS

## 2020-09-04 ENCOUNTER — Ambulatory Visit: Payer: 59 | Admitting: Family Medicine

## 2020-11-01 ENCOUNTER — Other Ambulatory Visit: Payer: Self-pay | Admitting: Family Medicine

## 2020-11-01 DIAGNOSIS — Z1231 Encounter for screening mammogram for malignant neoplasm of breast: Secondary | ICD-10-CM

## 2021-01-15 ENCOUNTER — Encounter: Payer: Self-pay | Admitting: Cardiovascular Disease

## 2021-01-15 ENCOUNTER — Other Ambulatory Visit: Payer: Self-pay

## 2021-01-15 ENCOUNTER — Ambulatory Visit (INDEPENDENT_AMBULATORY_CARE_PROVIDER_SITE_OTHER): Payer: 59 | Admitting: Cardiovascular Disease

## 2021-01-15 DIAGNOSIS — I10 Essential (primary) hypertension: Secondary | ICD-10-CM | POA: Diagnosis not present

## 2021-01-15 DIAGNOSIS — R002 Palpitations: Secondary | ICD-10-CM

## 2021-01-15 NOTE — Assessment & Plan Note (Signed)
History of essential hypertension a blood pressure measured today 122/77.  She is on HydroDIURIL.

## 2021-01-15 NOTE — Patient Instructions (Signed)

## 2021-01-15 NOTE — Assessment & Plan Note (Signed)
History of palpitations on beta-blocker in the past which was discontinued.  Her primary care provider placed her on Zyprexa which improved her palpitations.  She is peri menopause.  No further evaluation is warranted at this time.

## 2021-01-15 NOTE — Progress Notes (Signed)
01/15/2021 Andrea Palmer   [birth year]  [id number]  Primary Physician Patient, No Pcp Per (Inactive) Primary Cardiologist: Lorretta Harp MD Garret Reddish, [place], [place]  HPI:  Andrea Palmer is a 50 y.o. moderately overweight single African-American female mother of 2 children, grandmother of 1 grandchild who works as a Haematologist.  She was referred by her PCP, Dr. Sheppard Penton, for evaluation of palpitations.  Her risk factors include treated hypertension and hyperlipidemia.  She does not smoke or drink alcohol.  There is no family history for heart disease.  She is never had a heart attack or stroke.  She denies chest pain or shortness of breath.  She does work at a [employer] and walks on the treadmill and does yoga at home.  She is Natale Thoma menopause.  She was having palpitations and was placed on Toprol but ultimately this was discontinued.  She was then placed on Zyprexa which resulted in improvement in her palpitations.   Current Meds  Medication Sig   b complex vitamins tablet Take 1 tablet by mouth daily.   Coenzyme Q10 (COQ10) 100 MG CAPS Take 100 mg by mouth daily.    enalapril (VASOTEC) 20 MG tablet Take 2 tablets (40 mg total) by mouth daily.   hydrochlorothiazide (HYDRODIURIL) 25 MG tablet Take 1 tablet (25 mg total) by mouth daily.   lubiprostone (AMITIZA) 8 MCG capsule TAKE 1 CAPSULE(8 MCG) BY MOUTH TWICE DAILY   metoprolol succinate (TOPROL-XL) 50 MG 24 hr tablet Take 1 tablet (50 mg total) by mouth at bedtime.   OLANZapine (ZYPREXA) 5 MG tablet Take 1 tablet (5 mg total) by mouth at bedtime.   rosuvastatin (CRESTOR) 10 MG tablet Take 10 mg by mouth daily.     No Known Allergies  Social History   Socioeconomic History   Marital status: Single    Spouse name: Not on file   Number of children: 2   Years of education: 14   Highest education level: Not on file  Occupational History   Occupation: Hair stylist   Tobacco Use   Smoking status: Never   Smokeless  tobacco: Never  Vaping Use   Vaping Use: Never used  Substance and Sexual Activity   Alcohol use: No    Alcohol/week: 0.0 standard drinks   Drug use: No   Sexual activity: Yes    Partners: Male    Birth control/protection: None  Other Topics Concern   Not on file  Social History Narrative   Born and raised in [place]. Currently resides in an apartment with her son. Has a rabbit. Fun: watch tv (law and order; criminal minds); Denies any religious beliefs that would effect health care.   Social Determinants of Health   Financial Resource Strain: Not on file  Food Insecurity: Not on file  Transportation Needs: Not on file  Physical Activity: Not on file  Stress: Not on file  Social Connections: Not on file  Intimate Partner Violence: Not on file     Review of Systems: General: negative for chills, fever, night sweats or weight changes.  Cardiovascular: negative for chest pain, dyspnea on exertion, edema, orthopnea, palpitations, paroxysmal nocturnal dyspnea or shortness of breath Dermatological: negative for rash Respiratory: negative for cough or wheezing Urologic: negative for hematuria Abdominal: negative for nausea, vomiting, diarrhea, bright red blood per rectum, melena, or hematemesis Neurologic: negative for visual changes, syncope, or dizziness All other systems reviewed and are otherwise negative except as noted above.  Blood pressure 122/77, pulse 83, height 5\' 2"  (1.575 m), weight 149 lb (67.6 kg), SpO2 97 %.  General appearance: alert and no distress Neck: no adenopathy, no carotid bruit, no JVD, supple, symmetrical, trachea midline, and thyroid not enlarged, symmetric, no tenderness/mass/nodules Lungs: clear to auscultation bilaterally Heart: regular rate and rhythm, S1, S2 normal, no murmur, click, rub or gallop Extremities: extremities normal, atraumatic, no cyanosis or edema Pulses: 2+ and symmetric Skin: Skin color, texture, turgor normal. No rashes or  lesions Neurologic: Grossly normal  EKG sinus rhythm 83 without ST or T wave changes.  I personally reviewed this EKG.  ASSESSMENT AND PLAN:   Essential hypertension History of essential hypertension a blood pressure measured today 122/77.  She is on HydroDIURIL.  Palpitations History of palpitations on beta-blocker in the past which was discontinued.  Her primary care provider placed her on Zyprexa which improved her palpitations.  She is peri menopause.  No further evaluation is warranted at this time.     Lorretta Harp MD FACP,FACC,FAHA, [facility] 01/15/2021 2:25 PM

## 2021-02-03 ENCOUNTER — Encounter: Payer: Self-pay | Admitting: Gastroenterology

## 2021-02-03 ENCOUNTER — Ambulatory Visit (INDEPENDENT_AMBULATORY_CARE_PROVIDER_SITE_OTHER): Payer: 59 | Admitting: Gastroenterology

## 2021-02-03 VITALS — BP 130/84 | HR 76 | Ht 62.0 in | Wt 149.0 lb

## 2021-02-03 DIAGNOSIS — K5900 Constipation, unspecified: Secondary | ICD-10-CM | POA: Diagnosis not present

## 2021-02-03 DIAGNOSIS — D3A8 Other benign neuroendocrine tumors: Secondary | ICD-10-CM

## 2021-02-03 DIAGNOSIS — R932 Abnormal findings on diagnostic imaging of liver and biliary tract: Secondary | ICD-10-CM

## 2021-02-03 DIAGNOSIS — K6289 Other specified diseases of anus and rectum: Secondary | ICD-10-CM | POA: Diagnosis not present

## 2021-02-03 MED ORDER — NA SULFATE-K SULFATE-MG SULF 17.5-3.13-1.6 GM/177ML PO SOLN: 1.0000 | Freq: Once | ORAL | 0 refills | Status: AC

## 2021-02-03 MED ORDER — AMBULATORY NON FORMULARY MEDICATION: 1 refills | Status: DC

## 2021-02-03 NOTE — Patient Instructions (Addendum)
It was a pleasure to see you today.  I recommend: Eat 25-30 g of fiber in your diet through fruits, vegetables, and bran fibers 2. Sitz bath after a bowel movement will help with the pain (epsome salt in a bathtub with warm water, sit in the warm water 10 minutes up to twice a day as needed) 3. Citrucel or Benefiber 1 tblsp twice daily, titrate to a soft, formed stool 4. Limit your time on the toilet. Sit no longer than 5 minutes. Resist straining. 5. Before wiping after a bowel movement, swipe the paper into a jar of vaseline to give the paper a slight covering of the vaseline until fully healed. This will help reduce irriation and discomfort to the area. 7. Start using Diltazem ointment with lidocaine applied to your rectum four times daily 8. Call if no improvement in 4 weeks  9. 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   Your provider has prescribed Diltazem with Lidocaine gel for you. Please follow the directions written on your prescription bottle or given to you specifically by your provider. Since this is a specialty medication and is not readily available at most local pharmacies, we have sent your prescription to:  [facility] information is below: Address: [address] [place], [place] [address]  [phone]  *Please DO NOT go directly from our office to pick up this medication! Give the pharmacy 1 day to process the prescription as this is compounded and takes time to make.

## 2021-02-03 NOTE — Progress Notes (Signed)
Referring Provider: No ref. provider found Primary Care Physician:  Patient, No Pcp Per (Inactive)  Chief complain:  Abdominal pain   IMPRESSION:  Well-differentiated, low grade rectal neuroendocrine tumor    - resected 7/21    - chromogranin A 64.1 01/24/20 Multiple low-attenuation hepatic lesions on CT    - no evidence for mets on MRI, consistent with cysts Acute rectal pain without source identified on rectal exam Chronic constipation with ? Associated pelvic floor dyssfunction    - normal TSH and calcium    - improved with dietary changes Tubular adenoma on colonoscopy 5/21 No known family history of colon cancer or polyps   Rectal neuroendocrine tumor: Over due surveillance EUS 7/22 with Dr. Rush Landmark. Plan chromogranin A and rectal EUS.   Acute rectal pain: Will start empiric treatment for rectal fissure, although no source seen on physical exam today. Will plan further evaluation at the time of rectal EUS.  Chronic constipation: Continue lifestyle manifestations. Discussed anorectal manometry + pelvic floor PT/biofeedback. She may call to schedule at any time.   History of colon polyps: Surveillance colonoscopy due 2028, however, may want to consider earlier.   PLAN: - Chromogranin A level today - Continue high fiber diet, drink at least 64 ounces of water, continue to use flax seed - Sitz baths two to three times daily for pain relief - Benefiber or Citrucel QD-BID - Diltiazem 2% compounded with lidocaine 5% ointment applied to the rectum 4 times daily - Rectal ultrasound to follow-up on neuroendocrine tumor - Return to this office in 6-8 weeks, earlier if needed   Please see the "Patient Instructions" section for addition details about the plan.  HPI: Andrea Palmer is a 50 y.o. female who returns in follow-up for abdominal pain amd a rectal neuroendocrine tumor. She is a Theme park manager.    Initially seen in consultation 08/24/19 for a history of internal left  lower quadrant sore/abdominal pain last year that improved with defecation in the setting of a lifetime history of constipation and chronic bloating. Using flax seed gel oils with complete relief of symptoms at the time of her consultation, with addition of Dulocolax if no BM after 3 days. Intermittent sense of incomplete evacuation. No manual assistance with defecation. No blood or mucous.   Colonoscopy 08/31/2019 showed a 1 mm ascending colon tubular adenoma, a 4 mm submucosal rectal well-differentiated neuroendocrine tumor, and internal hemorrhoids. Ki-67 was <1%.   Endoscopic ultrasound was performed by Dr. Rush Landmark 10/23/2019 revealing a 6 mm submucosal nodule in the distal rectum.  EUS showed local wall thickening. Surveillance endoscopic ultrasound recommended in 1 year.  Labs 01/31/2020 showed a normal comprehensive metabolic panel except for a calcium of 10.4.  Liver enzymes were normal. Chromogranin A 64.1.   CT of the abdomen and pelvis 05/06/2020 showed multiple scattered pelvic lymph nodes less than 5 mm, scattered low-attenuation hepatic lesions that are difficult to characterize but are thought to be benign cysts, and no evidence of rectal mass.  There are age advanced atherosclerotic calcifications.  MRI with and without contrast 07/01/20 showed multiple T2 hyperintense liver lesions favoring cysts, the largest measuring 1.2 cm. Other lesions are too small to characterize. No evidence for liver mets.   Returns today overall feeling well except for severe pain that developed 6 weeks ago and felt like a hemorrhoids flare. She is having a bowel movement every 2-3 days without using a laxative. No urge to defecate. She has a sense of complete evacuation. No significant  change in bloating with defecation.   No change in symptoms with Amitiza, Linzess, and Miralax caused bloating.  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   Past Medical History:   Diagnosis Date   Anxiety    HTN (hypertension)     Past Surgical History:  Procedure Laterality Date   COLONOSCOPY     ENDOSCOPIC MUCOSAL RESECTION N/A 10/23/2019   Procedure: ENDOSCOPIC MUCOSAL RESECTION;  Surgeon: Rush Landmark Telford Nab., MD;  Location: [facility];  Service: Gastroenterology;  Laterality: N/A;   EUS N/A 10/23/2019   Procedure: LOWER ENDOSCOPIC ULTRASOUND (EUS);  Surgeon: Irving Copas., MD;  Location: [place];  Service: Gastroenterology;  Laterality: N/A;   FLEXIBLE SIGMOIDOSCOPY N/A 10/23/2019   Procedure: FLEXIBLE SIGMOIDOSCOPY;  Surgeon: Rush Landmark Telford Nab., MD;  Location: [facility];  Service: Gastroenterology;  Laterality: N/A;   HEMOSTASIS CLIP PLACEMENT  10/23/2019   Procedure: HEMOSTASIS CLIP PLACEMENT;  Surgeon: Irving Copas., MD;  Location: [place];  Service: Gastroenterology;;   SUBMUCOSAL LIFTING INJECTION  10/23/2019   Procedure: SUBMUCOSAL LIFTING INJECTION;  Surgeon: Irving Copas., MD;  Location: [facility];  Service: Gastroenterology;;   TUBAL LIGATION      Current Outpatient Medications  Medication Sig Dispense Refill   b complex vitamins tablet Take 1 tablet by mouth daily.     Coenzyme Q10 (COQ10) 100 MG CAPS Take 100 mg by mouth daily.      hydrochlorothiazide (HYDRODIURIL) 12.5 MG tablet Take 12.5 mg by mouth daily.     lubiprostone (AMITIZA) 8 MCG capsule TAKE 1 CAPSULE(8 MCG) BY MOUTH TWICE DAILY 60 capsule 1   OLANZapine (ZYPREXA) 5 MG tablet Take 1 tablet (5 mg total) by mouth at bedtime. 90 tablet 3   rosuvastatin (CRESTOR) 10 MG tablet Take 10 mg by mouth daily.     No current facility-administered medications for this visit.    Allergies as of 02/03/2021   (No Known Allergies)    Family History  Problem Relation Age of Onset   Congestive Heart Failure Mother    Hypertension Sister    Colon cancer Neg Hx    Esophageal cancer Neg Hx    Rectal cancer Neg Hx    Stomach cancer Neg Hx        Physical Exam: General:   Alert,  well-nourished, pleasant and cooperative in NAD Head:  Normocephalic and atraumatic. Eyes:  Sclera clear, no icterus.   Conjunctiva pink. Abdomen:  Soft, nontender, nondistended, normal bowel sounds, no rebound or guarding. No hepatosplenomegaly.   Rectal:   No chemical dermatitis. No external hemorrhoids, fissure or fistula. No prolapsing hemorrhoids. No rectal prolapse. Normal anocutaneous reflex. No stool in the rectal vault. No mass or fecal impaction. Normal anal resting tone.   Msk:  Symmetrical. No boney deformities LAD: No inguinal or umbilical LAD Extremities:  No clubbing or edema. Neurologic:  Alert and  oriented x4;  grossly nonfocal Skin:  Intact without significant lesions or rashes. Psych:  Alert and cooperative. Normal mood and affect.    Bradyn Vassey L. Tarri Glenn, MD, MPH 02/03/2021, 11:22 AM

## 2021-02-21 ENCOUNTER — Ambulatory Visit
Admission: RE | Admit: 2021-02-21 | Discharge: 2021-02-21 | Disposition: A | Discharge status: Home / Self Care | Payer: 59 | Source: Ambulatory Visit | Attending: Family Medicine | Admitting: Family Medicine

## 2021-02-21 ENCOUNTER — Other Ambulatory Visit: Payer: Self-pay

## 2021-02-21 DIAGNOSIS — Z1231 Encounter for screening mammogram for malignant neoplasm of breast: Secondary | ICD-10-CM

## 2021-03-05 ENCOUNTER — Encounter (HOSPITAL_COMMUNITY): Payer: Self-pay | Admitting: Gastroenterology

## 2021-03-05 NOTE — Progress Notes (Signed)
Attempted to obtain medical history via telephone, unable to reach at this time. I left a voicemail to return pre surgical testing department's phone call.  

## 2021-03-17 ENCOUNTER — Other Ambulatory Visit: Payer: Self-pay

## 2021-03-17 ENCOUNTER — Encounter (HOSPITAL_COMMUNITY)
Admission: RE | Disposition: A | Discharge status: Home / Self Care | Payer: Self-pay | Source: Home / Self Care | Attending: Gastroenterology

## 2021-03-17 ENCOUNTER — Ambulatory Visit (HOSPITAL_COMMUNITY): Payer: 59 | Admitting: Anesthesiology

## 2021-03-17 ENCOUNTER — Ambulatory Visit (HOSPITAL_COMMUNITY)
Admission: RE | Admit: 2021-03-17 | Discharge: 2021-03-17 | Disposition: A | Discharge status: Home / Self Care | Payer: 59 | Attending: Gastroenterology | Admitting: Gastroenterology

## 2021-03-17 ENCOUNTER — Encounter (HOSPITAL_COMMUNITY): Payer: Self-pay | Admitting: Gastroenterology

## 2021-03-17 DIAGNOSIS — Z9889 Other specified postprocedural states: Secondary | ICD-10-CM | POA: Diagnosis not present

## 2021-03-17 DIAGNOSIS — D3A8 Other benign neuroendocrine tumors: Secondary | ICD-10-CM

## 2021-03-17 DIAGNOSIS — K640 First degree hemorrhoids: Secondary | ICD-10-CM | POA: Insufficient documentation

## 2021-03-17 DIAGNOSIS — I899 Noninfective disorder of lymphatic vessels and lymph nodes, unspecified: Secondary | ICD-10-CM | POA: Diagnosis not present

## 2021-03-17 DIAGNOSIS — F419 Anxiety disorder, unspecified: Secondary | ICD-10-CM | POA: Insufficient documentation

## 2021-03-17 DIAGNOSIS — K621 Rectal polyp: Secondary | ICD-10-CM | POA: Insufficient documentation

## 2021-03-17 DIAGNOSIS — K644 Residual hemorrhoidal skin tags: Secondary | ICD-10-CM | POA: Diagnosis not present

## 2021-03-17 DIAGNOSIS — Z86012 Personal history of benign carcinoid tumor: Secondary | ICD-10-CM | POA: Insufficient documentation

## 2021-03-17 DIAGNOSIS — I10 Essential (primary) hypertension: Secondary | ICD-10-CM | POA: Insufficient documentation

## 2021-03-17 DIAGNOSIS — K6289 Other specified diseases of anus and rectum: Secondary | ICD-10-CM

## 2021-03-17 HISTORY — PX: EUS: SHX5427

## 2021-03-17 HISTORY — PX: FLEXIBLE SIGMOIDOSCOPY: SHX5431

## 2021-03-17 HISTORY — PX: BIOPSY: SHX5522

## 2021-03-17 SURGERY — ULTRASOUND, LOWER GI TRACT, ENDOSCOPIC
Anesthesia: Monitor Anesthesia Care

## 2021-03-17 MED ORDER — PROPOFOL 10 MG/ML IV BOLUS
INTRAVENOUS | Status: DC | PRN
  Administered 2021-03-17: 50 mg via INTRAVENOUS
  Administered 2021-03-17: 20 mg via INTRAVENOUS

## 2021-03-17 MED ORDER — PROPOFOL 500 MG/50ML IV EMUL
INTRAVENOUS | Status: DC | PRN
  Administered 2021-03-17: 125 ug/kg/min via INTRAVENOUS

## 2021-03-17 MED ORDER — LIDOCAINE 2% (20 MG/ML) 5 ML SYRINGE
INTRAMUSCULAR | Status: DC | PRN
  Administered 2021-03-17: 100 mg via INTRAVENOUS

## 2021-03-17 MED ORDER — PHENYLEPHRINE 40 MCG/ML (10ML) SYRINGE FOR IV PUSH (FOR BLOOD PRESSURE SUPPORT)
PREFILLED_SYRINGE | INTRAVENOUS | Status: DC | PRN
Start: 2021-03-17 — End: 2021-03-17
  Administered 2021-03-17: 120 ug via INTRAVENOUS
  Administered 2021-03-17: 80 ug via INTRAVENOUS

## 2021-03-17 MED ORDER — LACTATED RINGERS IV SOLN
INTRAVENOUS | Status: AC | PRN
  Administered 2021-03-17: 1000 mL via INTRAVENOUS

## 2021-03-17 MED ORDER — SODIUM CHLORIDE 0.9 % IV SOLN: INTRAVENOUS | Status: DC

## 2021-03-17 NOTE — Anesthesia Procedure Notes (Signed)
Date/Time: 03/17/2021 11:12 AM Performed by: Sharlette Dense, CRNA Oxygen Delivery Method: Simple face mask

## 2021-03-17 NOTE — Op Note (Signed)
[facility] Patient Name: Andrea Palmer Procedure Date: 03/17/2021 MRN: [id number] Attending MD: Justice Britain , MD Date of Birth: 1970-12-30 CSN: [id number] Age: 50 Admit Type: Outpatient Procedure:                Lower EUS Indications:              Neuroendocrine Tumor (status post mucosal resection                            in 2021 with negative margins) Providers:                Justice Britain, MD, Burtis Junes, RN, [facility]                            Technician, Technician Referring MD:             Thornton Park MD, MD Medicines:                Monitored Anesthesia Care Complications:            No immediate complications. Estimated Blood Loss:     Estimated blood loss was minimal. Procedure:                Pre-Anesthesia Assessment:                           - Prior to the procedure, a History and Physical                            was performed, and patient medications and                            allergies were reviewed. The patient's tolerance of                            previous anesthesia was also reviewed. The risks                            and benefits of the procedure and the sedation                            options and risks were discussed with the patient.                            All questions were answered, and informed consent                            was obtained. Prior Anticoagulants: The patient has                            taken no previous anticoagulant or antiplatelet                            agents except for aspirin. ASA Grade Assessment: II                            -  A patient with mild systemic disease. After                            reviewing the risks and benefits, the patient was                            deemed in satisfactory condition to undergo the                            procedure.                           After obtaining informed consent, the endoscope was                            passed  under direct vision. Throughout the                            procedure, the patient's blood pressure, pulse, and                            oxygen saturations were monitored continuously. The                            [id number] ([id number]) Olympus therapeutic endoscope                            was introduced through the anus and advanced to the                            the descending colon. The [id number] ([id number])                            Olympus radial ultrasound scope was introduced                            through the anus and advanced to the the                            rectosigmoid junction for ultrasound. The lower EUS                            was accomplished without difficulty. The patient                            tolerated the procedure. Scope In: Scope Out: Findings:      ENDOSCOPIC FINDING: :      Normal mucosa was found in the left colon.      Non-bleeding non-thrombosed external and internal hemorrhoids were found       during retroflexion, during perianal exam and during digital exam. The       hemorrhoids were Grade I (internal hemorrhoids that do not prolapse).      A medium post mucosectomy scar was found in the distal rectum. There was       some  polypoid tissue - most likely clip artifact. After the EUS was       complete, biopsies were taken with a cold forceps for histology.      The digital rectal exam findings include hemorrhoids. Pertinent       negatives include no palpable rectal lesions.      ENDOSONOGRAPHIC FINDING: :      Local wall thickening was found in the rectum at area of scar site. This       was encountered from 5 to 6 cm (from the anal verge). This finding       appeared to be primarily due to increased thickness of the deep mucosa       (Layer 2).      The rectum was otherwise normal.      The rectosigmoid junction was normal.      No malignant-appearing lymph nodes were visualized in the perirectal       region and in the left  iliac region. The nodes were.      The internal anal sphincter was visualized endosonographically and       appeared normal. Impression:               FLEX Impression:                           - Normal mucosa in the left colon.                           - Non-bleeding non-thrombosed external and internal                            hemorrhoids.                           - Post mucosectomy scar in the distal rectum. Small                            polypoid tissue present - likely clip artifact. To                            be sure, this area was biopsied.                           - Hemorrhoids found on digital rectal exam.                           EUS Impression:                           - Local wall thickening was visualized                            endosonographically in the rectum. This was due to                            increased thickness of the deep mucosa (Layer 2).                           - Endosonographic  images of the rectum were                            unremarkable.                           - Endosonographic imaging showed no sign of                            significant pathology at the rectosigmoid junction.                           - No malignant-appearing lymph nodes were                            visualized endosonographically in the perirectal                            region and in the left iliac region.                           - The internal anal sphincter was visualized                            endosonographically and appeared normal. Moderate Sedation:      Not Applicable - Patient had care per Anesthesia. Recommendation:           - The patient will be observed post-procedure,                            until all discharge criteria are met.                           - Discharge patient to home.                           - Patient has a contact number available for                            emergencies. The signs and symptoms of potential                             delayed complications were discussed with the                            patient. Return to normal activities tomorrow.                            Written discharge instructions were provided to the                            patient.                           - Resume previous diet.                           -  Await path results.                           - Repeat lower endoscopic ultrasound in 2 years for                            surveillance of NET.                           - Follow up with primary GI, Dr. Tarri Glenn for any                            other issues in interim.                           - The findings and recommendations were discussed                            with the patient.                           - The findings and recommendations were discussed                            with the designated responsible adult. Procedure Code(s):        --- Professional ---                           [phone], Sigmoidoscopy, flexible; with endoscopic                            ultrasound examination                           13086, Sigmoidoscopy, flexible; with biopsy, single                            or multiple Diagnosis Code(s):        --- Professional ---                           K64.0, First degree hemorrhoids                           Z98.890, Other specified postprocedural states                           K62.89, Other specified diseases of anus and rectum                           I89.9, Noninfective disorder of lymphatic vessels                            and lymph nodes, unspecified CPT copyright 2019 American Medical Association. All rights reserved. The codes documented in this report are preliminary and upon coder review may  be revised to meet current compliance requirements. Justice Britain, MD 03/17/2021  11:57:15 AM Number of Addenda: 0

## 2021-03-17 NOTE — H&P (Signed)
GASTROENTEROLOGY PROCEDURE H&P NOTE   Primary Care Physician: Patient, No Pcp Per (Inactive)  HPI: Andrea Palmer is a 50 y.o. female who presents for lower EUS status post EMR in 7/21 (negative margins).  Past Medical History:  Diagnosis Date   Anxiety    HTN (hypertension)    Past Surgical History:  Procedure Laterality Date   COLONOSCOPY     ENDOSCOPIC MUCOSAL RESECTION N/A 10/23/2019   Procedure: ENDOSCOPIC MUCOSAL RESECTION;  Surgeon: Rush Landmark Telford Nab., MD;  Location: [facility];  Service: Gastroenterology;  Laterality: N/A;   EUS N/A 10/23/2019   Procedure: LOWER ENDOSCOPIC ULTRASOUND (EUS);  Surgeon: Irving Copas., MD;  Location: [place];  Service: Gastroenterology;  Laterality: N/A;   FLEXIBLE SIGMOIDOSCOPY N/A 10/23/2019   Procedure: FLEXIBLE SIGMOIDOSCOPY;  Surgeon: Rush Landmark Telford Nab., MD;  Location: [place];  Service: Gastroenterology;  Laterality: N/A;   HEMOSTASIS CLIP PLACEMENT  10/23/2019   Procedure: HEMOSTASIS CLIP PLACEMENT;  Surgeon: Irving Copas., MD;  Location: [place];  Service: Gastroenterology;;   SUBMUCOSAL LIFTING INJECTION  10/23/2019   Procedure: SUBMUCOSAL LIFTING INJECTION;  Surgeon: Irving Copas., MD;  Location: [place];  Service: Gastroenterology;;   TUBAL LIGATION     No current facility-administered medications for this encounter.   No current facility-administered medications for this encounter. No Known Allergies Family History  Problem Relation Age of Onset   Congestive Heart Failure Mother    Hypertension Sister    Colon cancer Neg Hx    Esophageal cancer Neg Hx    Rectal cancer Neg Hx    Stomach cancer Neg Hx    Social History   Socioeconomic History   Marital status: Single    Spouse name: Not on file   Number of children: 2   Years of education: 14   Highest education level: Not on file  Occupational History   Occupation: Hair stylist   Tobacco Use   Smoking  status: Never   Smokeless tobacco: Never  Vaping Use   Vaping Use: Never used  Substance and Sexual Activity   Alcohol use: No    Alcohol/week: 0.0 standard drinks   Drug use: No   Sexual activity: Yes    Partners: Male    Birth control/protection: None  Other Topics Concern   Not on file  Social History Narrative   Born [place] raised in [place]. Currently resides in an apartment with her son. Has a rabbit. Fun: watch tv (law and order; criminal minds); Denies any religious beliefs that would effect health care.   Social Determinants of Health   Financial Resource Strain: Not on file  Food Insecurity: Not on file  Transportation Needs: Not on file  Physical Activity: Not on file  Stress: Not on file  Social Connections: Not on file  Intimate Partner Violence: Not on file    Physical Exam: There were no vitals filed for this visit. There is no height or weight on file to calculate BMI. GEN: NAD EYE: Sclerae anicteric ENT: MMM CV: Non-tachycardic GI: Soft, NT/ND NEURO:  Alert & Oriented x 3  Lab Results: No results for input(s): WBC, HGB, HCT, PLT in the last 72 hours. BMET No results for input(s): NA, K, CL, CO2, GLUCOSE, BUN, CREATININE, CALCIUM in the last 72 hours. LFT No results for input(s): PROT, ALBUMIN, AST, ALT, ALKPHOS, BILITOT, BILIDIR, IBILI in the last 72 hours. PT/INR No results for input(s): LABPROT, INR in the last 72 hours.   Impression / Plan: This  is a 50 y.o.female who presents for lower EUS status post EMR in 7/21 (negative margins).  The risks of an EUS including intestinal perforation, bleeding, infection, aspiration, and medication effects were discussed as was the possibility it may not give a definitive diagnosis if a biopsy is performed.    The risks and benefits of endoscopic evaluation/treatment were discussed with the patient and/or family; these include but are not limited to the risk of perforation, infection, bleeding, missed  lesions, lack of diagnosis, severe illness requiring hospitalization, as well as anesthesia and sedation related illnesses.  The patient's history has been reviewed, patient examined, no change in status, and deemed stable for procedure.  The patient and/or family is agreeable to proceed.    Justice Britain, MD [facility] Office # [phone]

## 2021-03-17 NOTE — Transfer of Care (Signed)
Immediate Anesthesia Transfer of Care Note  Patient: Andrea Palmer  Procedure(s) Performed: LOWER ENDOSCOPIC ULTRASOUND (EUS) FLEXIBLE SIGMOIDOSCOPY BIOPSY  Patient Location: PACU and Endoscopy Unit  Anesthesia Type:MAC  Level of Consciousness: awake, alert  and oriented  Airway & Oxygen Therapy: Patient Spontanous Breathing and Patient connected to face mask  Post-op Assessment: Report given to RN and Post -op Vital signs reviewed and stable  Post vital signs: Reviewed and stable  Last Vitals:  Vitals Value Taken Time  BP 116/64 03/17/21 1150  Temp    Pulse 63 03/17/21 1151  Resp 16 03/17/21 1151  SpO2 100 % 03/17/21 1151  Vitals shown include unvalidated device data.  Last Pain:  Vitals:   03/17/21 1146  TempSrc:   PainSc: Asleep         Complications: No notable events documented.

## 2021-03-17 NOTE — Discharge Instructions (Signed)

## 2021-03-17 NOTE — Anesthesia Postprocedure Evaluation (Signed)
Anesthesia Post Note  Patient: Andrea Palmer  Procedure(s) Performed: LOWER ENDOSCOPIC ULTRASOUND (EUS) FLEXIBLE SIGMOIDOSCOPY BIOPSY     Patient location during evaluation: PACU Anesthesia Type: MAC Level of consciousness: awake and alert Pain management: pain level controlled Vital Signs Assessment: post-procedure vital signs reviewed and stable Respiratory status: spontaneous breathing and respiratory function stable Cardiovascular status: stable Postop Assessment: no apparent nausea or vomiting Anesthetic complications: no   No notable events documented.  Last Vitals:  Vitals:   03/17/21 1200 03/17/21 1210  BP: 106/66 121/66  Pulse: 65 62  Resp: (!) 21 16  Temp: (!) 36.4 C   SpO2: 100% 100%    Last Pain:  Vitals:   03/17/21 1210  TempSrc:   PainSc: 0-No pain                 Quiana Cobaugh DANIEL

## 2021-03-17 NOTE — Anesthesia Preprocedure Evaluation (Addendum)
Anesthesia Evaluation  Patient identified by MRN, date of birth, ID band Patient awake    Reviewed: Allergy & Precautions, NPO status , Patient's Chart, lab work & pertinent test results  History of Anesthesia Complications Negative for: history of anesthetic complications  Airway Mallampati: II  TM Distance: >3 FB Neck ROM: Full    Dental no notable dental hx. (+) Dental Advisory Given   Pulmonary neg pulmonary ROS,    Pulmonary exam normal        Cardiovascular hypertension, Pt. on medications Normal cardiovascular exam     Neuro/Psych PSYCHIATRIC DISORDERS Anxiety negative neurological ROS     GI/Hepatic negative GI ROS, Neg liver ROS,   Endo/Other  negative endocrine ROS  Renal/GU negative Renal ROS     Musculoskeletal negative musculoskeletal ROS (+)   Abdominal (+) - obese,   Peds  Hematology negative hematology ROS (+)   Anesthesia Other Findings   Reproductive/Obstetrics negative OB ROS                            Anesthesia Physical  Anesthesia Plan  ASA: 2  Anesthesia Plan: MAC   Post-op Pain Management:    Induction: Intravenous  PONV Risk Score and Plan: 2 and Propofol infusion, TIVA, Treatment may vary due to age or medical condition and Midazolam  Airway Management Planned: Natural Airway and Simple Face Mask  Additional Equipment: None  Intra-op Plan:   Post-operative Plan:   Informed Consent: I have reviewed the patients History and Physical, chart, labs and discussed the procedure including the risks, benefits and alternatives for the proposed anesthesia with the patient or authorized representative who has indicated his/her understanding and acceptance.     Dental advisory given  Plan Discussed with: Anesthesiologist and CRNA  Anesthesia Plan Comments:        Anesthesia Quick Evaluation

## 2021-03-18 ENCOUNTER — Encounter (HOSPITAL_COMMUNITY): Payer: Self-pay | Admitting: Gastroenterology

## 2021-03-18 ENCOUNTER — Encounter: Payer: Self-pay | Admitting: Gastroenterology

## 2021-03-18 LAB — SURGICAL PATHOLOGY

## 2021-03-18 NOTE — Progress Notes (Signed)
Thanks for your help.

## 2021-05-06 ENCOUNTER — Ambulatory Visit: Payer: 59 | Admitting: Gastroenterology

## 2021-05-06 ENCOUNTER — Encounter: Payer: Self-pay | Admitting: Gastroenterology

## 2021-05-06 VITALS — BP 110/80 | HR 93 | Ht 62.0 in | Wt 146.0 lb

## 2021-05-06 DIAGNOSIS — D3A8 Other benign neuroendocrine tumors: Secondary | ICD-10-CM

## 2021-05-06 DIAGNOSIS — K5900 Constipation, unspecified: Secondary | ICD-10-CM | POA: Diagnosis not present

## 2021-05-06 NOTE — Progress Notes (Signed)
Referring Provider: No ref. provider found Primary Care Physician:  Caren Macadam, MD  Chief complain:  Abdominal pain   IMPRESSION:  Well-differentiated, low grade rectal neuroendocrine tumor    - resected 7/21    - chromogranin A 64.1 01/24/20    - no residual tumor on EUS 12/22    - surveillance recommended in 2024 Multiple low-attenuation hepatic lesions on CT    - no evidence for mets on MRI, consistent with cysts Acute rectal pain without source identified on rectal exam    - empiric treatment for fissure has resulted in improvement Chronic constipation with ? Associated pelvic floor dyssfunction    - normal TSH and calcium    - improved with dietary changes Tubular adenoma on colonoscopy 5/21 No known family history of colon cancer or polyps   Rectal neuroendocrine tumor: Surveillance EUS due 2024.    Acute rectal pain: May be fissure due to response to empiric treatment.  Chronic constipation: Continue lifestyle manifestations. Discussed empiric pelvic floor PT/biofeedback as she is not interested in anorectal manometry. She may call to schedule at any time.   History of colon polyps: Surveillance colonoscopy due 2028, however, may want to consider earlier.   PLAN: - Continue high fiber diet, drink at least 64 ounces of water, continue to use flax seed - Sitz baths two to three times daily for pain relief - Continue daily Benefiber - Continued Diltiazem 2% compounded with lidocaine 5% ointment applied to the rectum 4 times daily - Trial of Linzess 145 mcg daily (samples provided) - Referral for pelvic floor PT - She will follow-up by MyChart after completing physical therapy - Rectal ultrasound to follow-up on neuroendocrine tumor 12/24 - Return to this office annually, or earlier if needed   Please see the "Patient Instructions" section for addition details about the plan.  HPI: Andrea PEREZGARCIA is a 51 y.o. female who returns in follow-up for abdominal pain  amd a rectal neuroendocrine tumor. She is a [employer].    Initially seen in consultation 08/24/19 for a history of internal left lower quadrant sore/abdominal pain last year that improved with defecation in the setting of a lifetime history of constipation and chronic bloating. Using flax seed gel oils with complete relief of symptoms at the time of her consultation, with addition of Dulocolax if no BM after 3 days. Intermittent sense of incomplete evacuation. No manual assistance with defecation. No blood or mucous.   Colonoscopy 08/31/2019 showed a 1 mm ascending colon tubular adenoma, a 4 mm submucosal rectal well-differentiated neuroendocrine tumor, and internal hemorrhoids. Ki-67 was <1%.   Endoscopic ultrasound was performed by Dr. Rush Landmark 10/23/2019 revealed a 6 mm submucosal nodule in the distal rectum.  EUS showed local wall thickening. Surveillance endoscopic ultrasound recommended in 1 year.  Labs 01/31/2020 showed a normal comprehensive metabolic panel except for a calcium of 10.4.  Liver enzymes were normal. Chromogranin A 64.1.   CT of the abdomen and pelvis 05/06/2020 showed multiple scattered pelvic lymph nodes less than 5 mm, scattered low-attenuation hepatic lesions that are difficult to characterize but are thought to be benign cysts, and no evidence of rectal mass.  There are age advanced atherosclerotic calcifications.  MRI with and without contrast 07/01/20 showed multiple T2 hyperintense liver lesions favoring cysts, the largest measuring 1.2 cm. Other lesions are too small to characterize. No evidence for liver mets.   Returns today overall feeling well except for severe pain that developed 6 weeks ago and felt like  a hemorrhoids flare. She is having a bowel movement every 2-3 days without using a laxative. No urge to defecate. She has a sense of complete evacuation. No significant change in bloating with defecation.   EUS 03/17/21:  - Non-bleeding non-thrombosed external and  internal hemorrhoids. - Post mucosectomy scar in the distal rectum. Small polypoid tissue present - likely clip artifact. To be sure, this area was biopsied. Biopsies showed hyperplastic polyp. No dysplasia. No residual neuroendocrine neoplasms.  - Hemorrhoids found on digital rectal exam. EUS Impression: - Local wall thickening was visualized endosonographically in the rectum. This was due to increased thickness of the deep mucosa (Layer 2). - Endosonographic images of the rectum were unremarkable. - Endosonographic imaging showed no sign of significant pathology at the rectosigmoid junction. - No malignant-appearing lymph nodes were visualized endosonographically in the perirectal region and in the left iliac region. - The internal anal sphincter was visualized endosonographically and appeared normal.  Returns today with ongoing constipation despite benefiber.   Trial of Diltiazem 2% compounded with lidocaine 5% ointment applied to the rectum 4 times daily did not help until she used it before bowel movements.    Past Medical History:  Diagnosis Date   Anxiety    HTN (hypertension)     Past Surgical History:  Procedure Laterality Date   BIOPSY  03/17/2021   Procedure: BIOPSY;  Surgeon: Rush Landmark Telford Nab., MD;  Location: [facility];  Service: Gastroenterology;;   COLONOSCOPY     ENDOSCOPIC MUCOSAL RESECTION N/A 10/23/2019   Procedure: ENDOSCOPIC MUCOSAL RESECTION;  Surgeon: Irving Copas., MD;  Location: [facility];  Service: Gastroenterology;  Laterality: N/A;   EUS N/A 10/23/2019   Procedure: LOWER ENDOSCOPIC ULTRASOUND (EUS);  Surgeon: Irving Copas., MD;  Location: [facility];  Service: Gastroenterology;  Laterality: N/A;   EUS N/A 03/17/2021   Procedure: LOWER ENDOSCOPIC ULTRASOUND (EUS);  Surgeon: Irving Copas., MD;  Location: [facility];  Service: Gastroenterology;  Laterality: N/A;   FLEXIBLE SIGMOIDOSCOPY N/A 10/23/2019   Procedure:  FLEXIBLE SIGMOIDOSCOPY;  Surgeon: Rush Landmark Telford Nab., MD;  Location: [facility];  Service: Gastroenterology;  Laterality: N/A;   FLEXIBLE SIGMOIDOSCOPY N/A 03/17/2021   Procedure: FLEXIBLE SIGMOIDOSCOPY;  Surgeon: Rush Landmark Telford Nab., MD;  Location: [facility];  Service: Gastroenterology;  Laterality: N/A;   HEMOSTASIS CLIP PLACEMENT  10/23/2019   Procedure: HEMOSTASIS CLIP PLACEMENT;  Surgeon: Rush Landmark Telford Nab., MD;  Location: [facility];  Service: Gastroenterology;;   SUBMUCOSAL LIFTING INJECTION  10/23/2019   Procedure: SUBMUCOSAL LIFTING INJECTION;  Surgeon: Irving Copas., MD;  Location: [facility];  Service: Gastroenterology;;   TUBAL LIGATION      Current Outpatient Medications  Medication Sig Dispense Refill   AMBULATORY NON FORMULARY MEDICATION Diltazem with Lidocaine - Using your index finger, apply a small amount of medication inside the rectum up to your first knuckle/joint four times daily x 4 weeks. (Patient taking differently: Place 1 application rectally daily as needed. Diltazem with Lidocaine - Using your index finger, apply a small amount of medication inside the rectum up to your first knuckle/joint four times daily x 4 weeks.) 60 g 1   aspirin EC 81 MG tablet Take 81 mg by mouth daily. Swallow whole.     hydrochlorothiazide (HYDRODIURIL) 25 MG tablet Take 25 mg by mouth daily.     OLANZapine (ZYPREXA) 5 MG tablet Take 1 tablet (5 mg total) by mouth at bedtime. 90 tablet 3   rosuvastatin (CRESTOR) 10 MG tablet Take 10 mg by  mouth daily.     Vitamin D, Ergocalciferol, (DRISDOL) 1.25 MG (50000 UNIT) CAPS capsule Take 50,000 Units by mouth every Friday.     No current facility-administered medications for this visit.    Allergies as of 05/06/2021   (No Known Allergies)    Family History  Problem Relation Age of Onset   Congestive Heart Failure Mother    Hypertension Sister    Colon cancer Neg Hx    Esophageal cancer Neg Hx    Rectal  cancer Neg Hx    Stomach cancer Neg Hx       Physical Exam: General:   Alert,  well-nourished, pleasant and cooperative in NAD Head:  Normocephalic and atraumatic. Eyes:  Sclera clear, no icterus.   Conjunctiva pink. Abdomen:  Soft, nontender, nondistended, normal bowel sounds, no rebound or guarding. No hepatosplenomegaly.   Msk:  Symmetrical. No boney deformities LAD: No inguinal or umbilical LAD Extremities:  No clubbing or edema. Neurologic:  Alert and  oriented x4;  grossly nonfocal Skin:  Intact without significant lesions or rashes. Psych:  Alert and cooperative. Normal mood and affect.    Glenn Christo L. Tarri Glenn, MD, MPH 05/06/2021, 9:34 AM

## 2021-05-06 NOTE — Patient Instructions (Addendum)
It was my pleasure to provide care to you today. Based on our discussion, I am providing you with my recommendations below:  RECOMMENDATION(S):   I have recommend that you continue to use Benefiber every day and the Diltiazem ointment prior to bowel movements.  Physical therapy may make a big difference! We will place the referral today. Please send me a My Chart message with an update after you finish the recommended treatment.  We discussed adding Linzess to see if this helps your constipation. I would start with one pill daily. You can change the dose as needed if you are having diarrhea.  There are lower and higher doses of this medicine. We can use it until we figure out how best it works for you.    BMI:  If you are age 29 or older, your body mass index should be between 23-30. Your Body mass index is 26.7 kg/m. If this is out of the aforementioned range listed, please consider follow up with your Primary Care Provider.  If you are age 19 or younger, your body mass index should be between 19-25. Your Body mass index is 26.7 kg/m. If this is out of the aformentioned range listed, please consider follow up with your Primary Care Provider.   MY CHART:  The [facility] providers would like to encourage you to use [facility] to communicate with providers for non-urgent requests or questions.  Due to long hold times on the telephone, sending your provider a message by [facility] may be a faster and more efficient way to get a response.  Please allow 48 business hours for a response.  Please remember that this is for non-urgent requests.   Thank you for trusting me with your gastrointestinal care!    Thornton Park, MD, MPH

## 2021-05-22 IMAGING — MR MR ABDOMEN WO/W CM
18 series · 48 of 48 positions shown · IV contrast (7ml GADAVIST)
Comparison: 05/06/2020 abdominopelvic CT

CLINICAL DATA: Follow-up of hepatic lesions. History of carcinoid
tumor of the rectum. Resection last [REDACTED].

EXAM:
MRI ABDOMEN WITHOUT AND WITH CONTRAST
TECHNIQUE: Multiplanar multisequence MR imaging of the abdomen was performed
both before and after the administration of intravenous contrast.
CONTRAST:  7mL GADAVIST GADOBUTROL 1 MMOL/ML IV SOLN

[Series 3: T2 · coronal · 6.0mm · 1.56mm/px · 2 of 28 slices shown (1 of 2)]
[im 1/28]
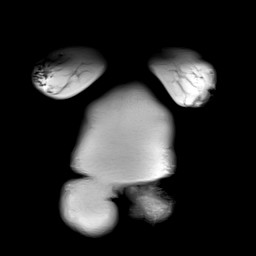
[im 28/28]
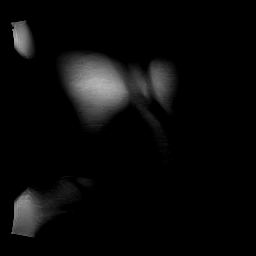

[Series 4: T2 fat-sat · axial · 6.0mm · 1.25mm/px · z∈[-77,+160]mm · 2 of 34 slices shown]
[im 1/34]
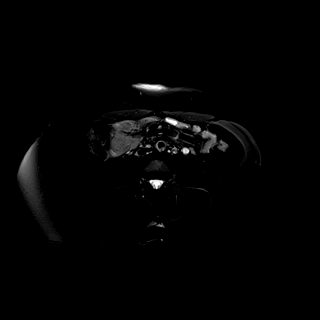
[im 34/34]
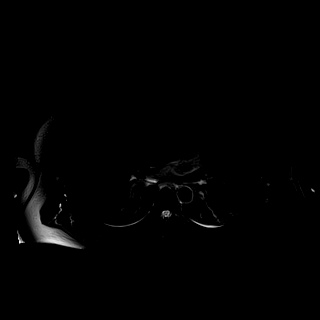

[Series 6: DWI · axial · 6.0mm · 1.49mm/px · z∈[-76,+176]mm · 4 of 72 slices shown (1 of 2)]
[im 1/72]
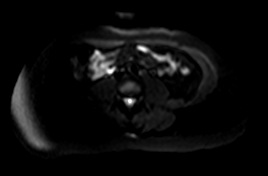
[im 24/72]
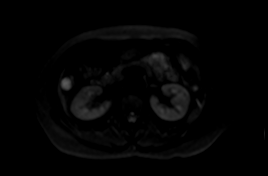
[im 48/72]
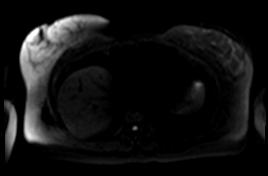
[im 72/72]
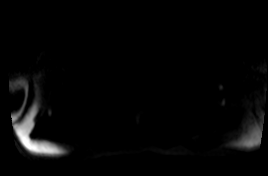

[Series 7: DWI · axial · 6.0mm · 1.49mm/px · z∈[-76,+176]mm · 2 of 36 slices shown (2 of 2)]
[im 1/36]
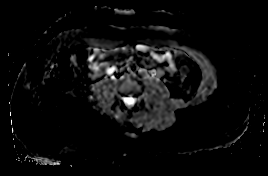
[im 36/36]
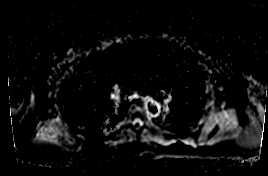

[Series 8: T1 · axial · 3.0mm · 1.25mm/px · z∈[-80,+181]mm · 3 of 88 slices shown (1 of 2)]
[im 1/88]
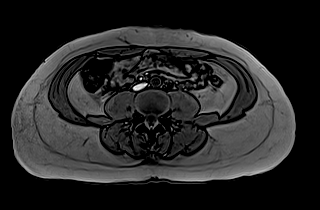
[im 44/88]
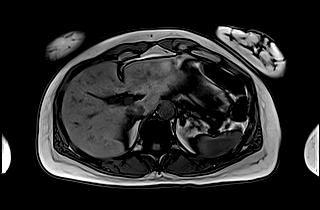
[im 88/88]
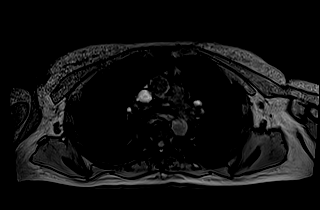

[Series 9: T1 · axial · 3.0mm · 1.25mm/px · z∈[-80,+181]mm · 3 of 88 slices shown (2 of 2)]
[im 1/88]
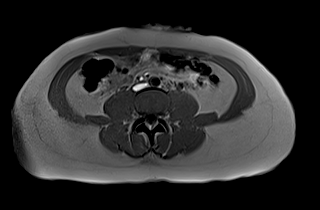
[im 44/88]
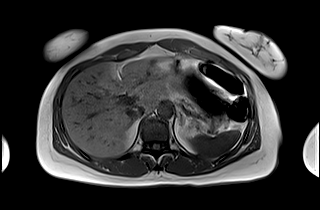
[im 88/88]
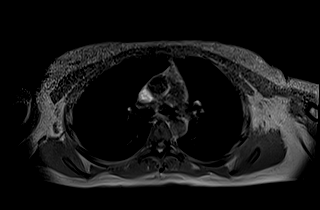

[Series 10: bSSFP · axial · 4.0mm · 0.82mm/px · z∈[-74,+162]mm · 2 of 60 slices shown]
[im 1/60]
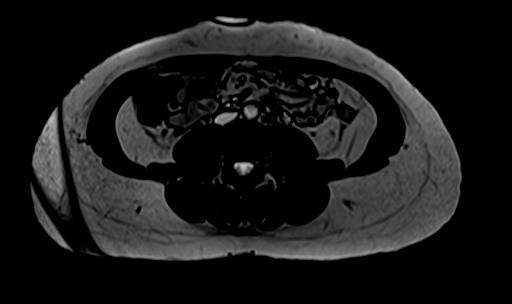
[im 60/60]
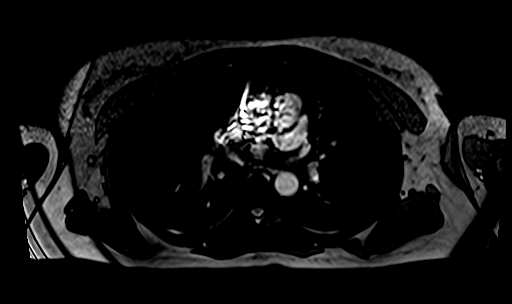

[Series 12: T1 dynamic · axial · 3.0mm · 1.25mm/px · z∈[-82,+179]mm · 3 of 88 slices shown (1 of 10)]
[im 1/88]
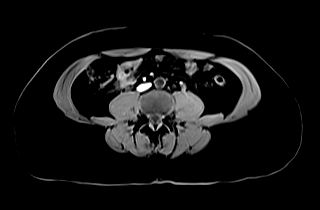
[im 44/88]
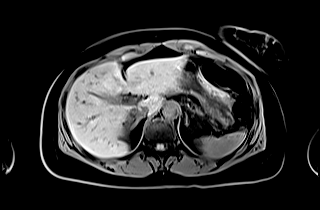
[im 88/88]
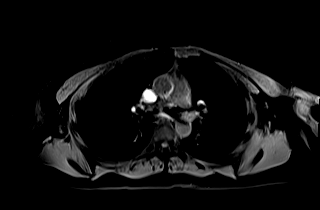

[Series 16: T1 dynamic · axial · 3.0mm · 1.25mm/px · z∈[-82,+179]mm · 3 of 88 slices shown (2 of 10)]
[im 1/88]
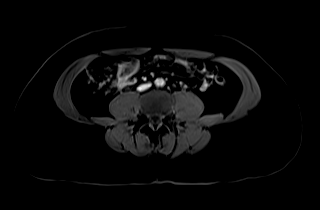
[im 44/88]
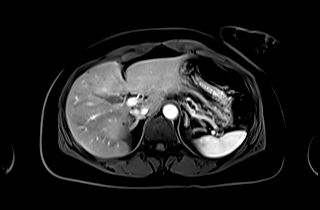
[im 88/88]
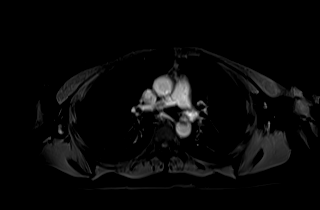

[Series 17: T1 dynamic · axial · 3.0mm · 1.25mm/px · z∈[-82,+179]mm · 3 of 88 slices shown (3 of 10)]
[im 1/88]
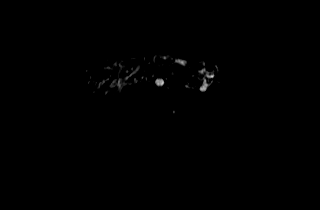
[im 44/88]
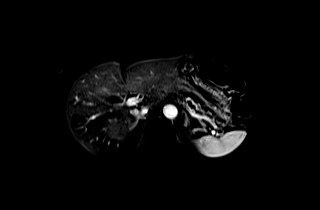
[im 88/88]
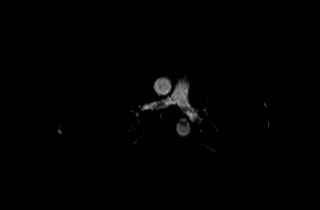

[Series 20: T1 dynamic · axial · 3.0mm · 1.25mm/px · z∈[-82,+179]mm · 3 of 88 slices shown (4 of 10)]
[im 1/88]
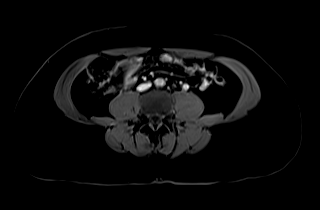
[im 44/88]
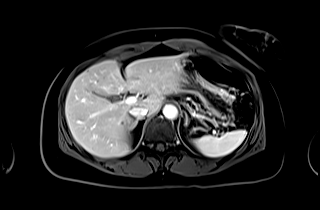
[im 88/88]
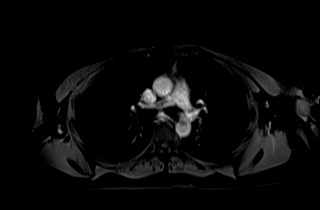

[Series 21: T1 dynamic · axial · 3.0mm · 1.25mm/px · z∈[-82,+179]mm · 3 of 88 slices shown (5 of 10)]
[im 1/88]
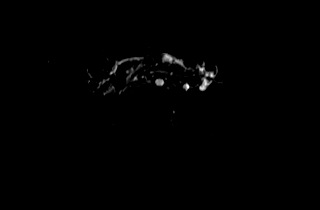
[im 44/88]
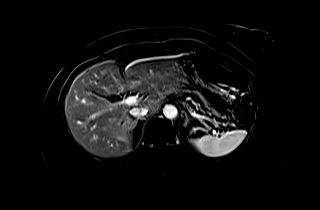
[im 88/88]
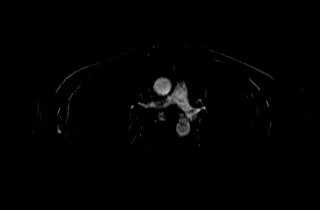

[Series 24: T1 dynamic · axial · 3.0mm · 1.25mm/px · z∈[-82,+179]mm · 3 of 88 slices shown (6 of 10)]
[im 1/88]
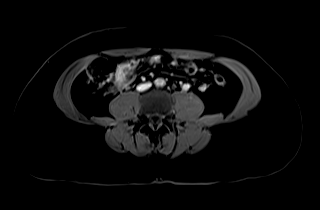
[im 44/88]
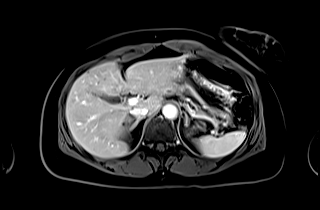
[im 88/88]
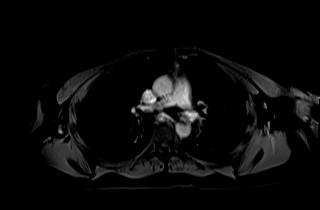

[Series 25: T1 dynamic · axial · 3.0mm · 1.25mm/px · z∈[-82,+179]mm · 3 of 88 slices shown (7 of 10)]
[im 1/88]
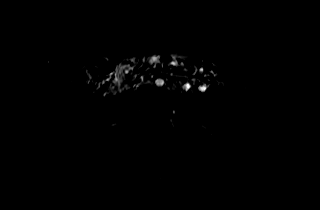
[im 44/88]
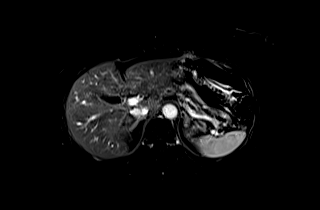
[im 88/88]
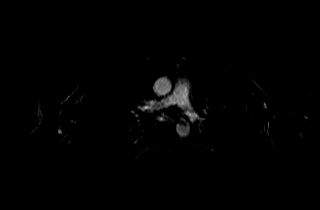

[Series 27: T1 dynamic · coronal · 3.0mm · 1.41mm/px · 2 of 64 slices shown (8 of 10)]
[im 1/64]
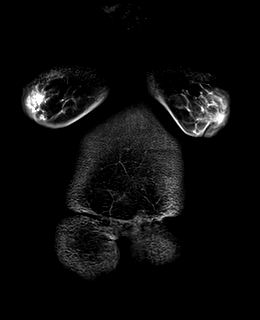
[im 64/64]
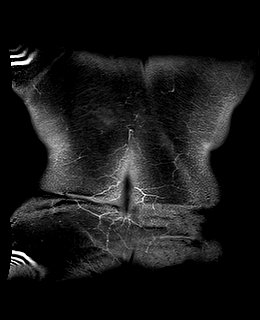

[Series 28: T2 · axial · 6.0mm · 1.46mm/px · 1 of 32 slices shown (2 of 2)]
[im 1/32]
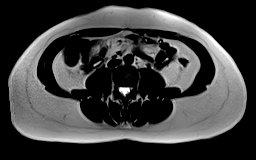

[Series 31: T1 dynamic · axial · 3.0mm · 1.25mm/px · z∈[-82,+179]mm · 3 of 88 slices shown (9 of 10)]
[im 1/88]
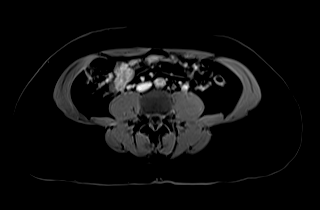
[im 44/88]
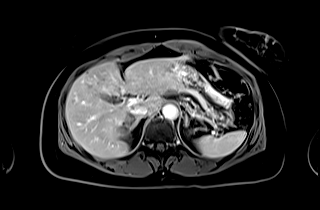
[im 88/88]
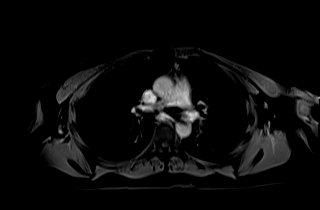

[Series 32: T1 dynamic · axial · 3.0mm · 1.25mm/px · z∈[-82,+179]mm · 3 of 88 slices shown (10 of 10)]
[im 1/88]
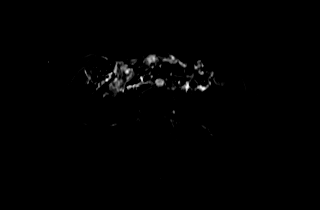
[im 44/88]
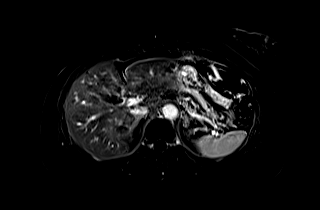
[im 88/88]
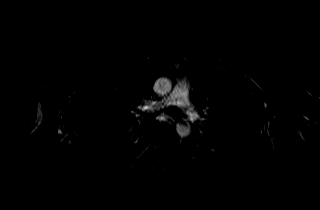

[48 of 48 positions shown; findings below may reference images not displayed]

FINDINGS: Lower chest: Normal heart size without pericardial or pleural
effusion.

Hepatobiliary: No cirrhosis. Multiple T2 hyperintense liver lesions
are identified, favoring cysts. The largest is within the hepatic
dome at 1.2 cm on [DATE]. This lesion demonstrates peripheral
enhancement, suggesting a component of complexity.

Other lesions are technically too small to characterize but likely
tiny cysts or bile duct hamartomas, based on T2 hyperintensity. No
suspicious liver lesion. Normal gallbladder, without biliary ductal
dilatation.

Pancreas:  Normal, without mass or ductal dilatation.

Spleen:  Normal in size, without focal abnormality.

Adrenals/Urinary Tract: Normal adrenal glands. Inter/lower pole left
renal too small to characterize lesion anteriorly. Normal right
kidney. No hydronephrosis.

Stomach/Bowel: Normal stomach and abdominal bowel loops.

Vascular/Lymphatic: Aortic atherosclerosis. No abdominal adenopathy.

Other:  No ascites.

Musculoskeletal: No acute osseous abnormality.
IMPRESSION: 1. No evidence of hepatic metastasis. Combination of hepatic cysts
and bile duct hamartomas as detailed above.
2.  Aortic Atherosclerosis ([id number]).

## 2021-05-29 ENCOUNTER — Encounter: Payer: Self-pay | Admitting: Physical Therapy

## 2021-05-29 ENCOUNTER — Encounter: Payer: 59 | Attending: Gastroenterology | Admitting: Physical Therapy

## 2021-05-29 ENCOUNTER — Other Ambulatory Visit: Payer: Self-pay

## 2021-05-29 DIAGNOSIS — R278 Other lack of coordination: Secondary | ICD-10-CM | POA: Diagnosis not present

## 2021-05-29 DIAGNOSIS — M6281 Muscle weakness (generalized): Secondary | ICD-10-CM | POA: Diagnosis not present

## 2021-05-29 NOTE — Patient Instructions (Signed)
Access Code: [email or web address] URL: [email or web address] Date: 05/29/2021 Prepared by: Earlie Counts  Exercises Sidelying Diaphragmatic Breathing - 2 x daily - 7 x weekly - 1 sets - 10 reps Seated Diaphragmatic Breathing - 2 x daily - 7 x weekly - 1 sets - 10 reps Seated Happy Baby With Trunk Flexion For Pelvic Relaxation - 3 x daily - 7 x weekly - 1 sets - 10 reps Earlie Counts, PT [facility] [address] [place], [place] [address] W: [phone] [email or web address]

## 2021-05-29 NOTE — Therapy (Signed)
[place] at [facility] [address] [place], [place], [address] Phone: [phone]   Fax:  [phone]  Physical Therapy Evaluation  Patient Details  Name: Andrea Palmer MRN: [id number] Date of Birth: [birth year] Referring Provider (PT): Dr. Thornton Park   Encounter Date: 05/29/2021   PT End of Session - 05/29/21 1029     Visit Number 1    Date for PT Re-Evaluation 08/21/21    Authorization Type Aet[employer]    PT Start Time 0930    PT Stop Time 1020    PT Time Calculation (min) 50 min    Activity Tolerance Patient tolerated treatment well    Behavior During Therapy [facility] for tasks assessed/performed             Past Medical History:  Diagnosis Date   Anxiety    HTN (hypertension)     Past Surgical History:  Procedure Laterality Date   BIOPSY  03/17/2021   Procedure: BIOPSY;  Surgeon: Irving Copas., MD;  Location: [facility];  Service: Gastroenterology;;   COLONOSCOPY     ENDOSCOPIC MUCOSAL RESECTION N/A 10/23/2019   Procedure: ENDOSCOPIC MUCOSAL RESECTION;  Surgeon: Irving Copas., MD;  Location: [place];  Service: Gastroenterology;  Laterality: N/A;   EUS N/A 10/23/2019   Procedure: LOWER ENDOSCOPIC ULTRASOUND (EUS);  Surgeon: Irving Copas., MD;  Location: [place];  Service: Gastroenterology;  Laterality: N/A;   EUS N/A 03/17/2021   Procedure: LOWER ENDOSCOPIC ULTRASOUND (EUS);  Surgeon: Irving Copas., MD;  Location: [facility];  Service: Gastroenterology;  Laterality: N/A;   FLEXIBLE SIGMOIDOSCOPY N/A 10/23/2019   Procedure: FLEXIBLE SIGMOIDOSCOPY;  Surgeon: Rush Landmark Telford Nab., MD;  Location: [place];  Service: Gastroenterology;  Laterality: N/A;   FLEXIBLE SIGMOIDOSCOPY N/A 03/17/2021   Procedure: FLEXIBLE SIGMOIDOSCOPY;  Surgeon: Rush Landmark Telford Nab., MD;  Location: [facility];  Service: Gastroenterology;  Laterality: N/A;   HEMOSTASIS CLIP  PLACEMENT  10/23/2019   Procedure: HEMOSTASIS CLIP PLACEMENT;  Surgeon: Irving Copas., MD;  Location: [place];  Service: Gastroenterology;;   SUBMUCOSAL LIFTING INJECTION  10/23/2019   Procedure: SUBMUCOSAL LIFTING INJECTION;  Surgeon: Irving Copas., MD;  Location: [place];  Service: Gastroenterology;;   TUBAL LIGATION      There were no vitals filed for this visit.    Subjective Assessment - 05/29/21 0937     Subjective Patient reports for 1 year the stools have stopped working. She is having to do laxatives. She started to go through menopause at this time. Never feels the urge to have a bowel movement. Patient has been doing the high fiber diet and drinking 64 ounces of water. I have not tried the Linzess. Pateint will not have a stool unless she takes a laxative and that could be 3-4 days. She will have an urge when taking the laxatives. Prior to last year she was constipated but had the urge to have a bowel movement. Prior to last year she would have a bowel movement 2 times per month and definitely during her period.    Patient Stated Goals be able to have a bowel movement without taking laxatives    Currently in Pain? No/denies    Multiple Pain Sites No                [facility] PT Assessment - 05/29/21 0001       Assessment   Medical Diagnosis D3A.8 [place] neuroendocrine tumor of rectum; K59.00 Constipation, unspecified constipation type  Referring Provider (PT) Dr. Thornton Park    Onset Date/Surgical Date 05/29/20    Prior Therapy none      Precautions   Precautions None      Restrictions   Weight Bearing Restrictions No      Balance Screen   Has the patient fallen in the past 6 months No    Has the patient had a decrease in activity level because of a fear of falling?  No    Is the patient reluctant to leave their home because of a fear of falling?  No      Home Ecologist residence      Prior Function    Level of Independence Independent    Vocation Part time employment    Vocation Requirements Hair stylist, standing    Leisure walk on the treadmill, yoga,      Cognition   Overall Cognitive Status Within Functional Limits for tasks assessed      Posture/Postural Control   Posture/Postural Control No significant limitations      ROM / Strength   AROM / PROM / Strength AROM;PROM;Strength      AROM   Overall AROM Comments full lumbar ROM      Strength   Overall Strength Comments difficulty with coordinating abdominal contraction, will lift her rib cage up intead    Right Hip Extension 4/5    Right Hip ABduction 4/5    Right Hip ADduction 4/5    Left Hip Extension 4/5    Left Hip ABduction 4/5    Left Hip ADduction 4/5                        Objective measurements completed on examination: See above findings.     Pelvic Floor Special Questions - 05/29/21 0001     Prior Pregnancies Yes    Number of Pregnancies 2    Number of Vaginal Deliveries 2   tore a little with the first child   Currently Sexually Active Yes    Is this Painful No    Urinary Leakage No    Fecal incontinence No   no strain due to laxative making the stool loose   Skin Integrity Intact    Perineal Body/Introitus  Other    Perineal Body/Introitus other tightness in the posterior perineal body    Pelvic Floor Internal Exam Patient comfirms identification and approves PT to assess pelvic floor and treatment    Exam Type Rectal    Palpation unable to place therapist index finger in the anal canal, able to place the pinky finger into the anal canal  going slowly using the [place] reveleum and working through the tight structures. Only able to place pinky to th PIP. Patient has trouble relaxing the pelvic floor with diaphragmatic breathing and bulging the pelvic floor    Tone increased                       PT Education - 05/29/21 1019     Education Details Access Code:  [email or web address]    Person(s) Educated Patient    Methods Explanation;Demonstration;Verbal cues;Handout    Comprehension Verbalized understanding;Returned demonstration              PT Short Term Goals - 05/29/21 1046       PT SHORT TERM GOAL #1   Title independent with initial HEP with ip stretches and diaphragmatic breathing  Time 4    Period Weeks    Status New    Target Date 06/26/21      PT SHORT TERM GOAL #2   Title ability to bulge the pelvic floor due to relaxation of the pelvic floor    Time 4    Period Weeks    Status New    Target Date 06/26/21      PT SHORT TERM GOAL #3   Title understand how to perform abdominal massage to improve peristalic motion of the intestines to promote bowel movements    Time 4    Period Weeks    Status New    Target Date 06/26/21               PT Long Term Goals - 05/29/21 1048       PT LONG TERM GOAL #1   Title Independent with advanced HEP for core and hip strength    Time 12    Period Weeks    Status New    Target Date 08/21/21      PT LONG TERM GOAL #2   Title ability to demonstrate correct toileting to expand the anus with diaphragmatic breathing and push the stool out    Time 12    Period Weeks    Status New    Target Date 08/21/21      PT LONG TERM GOAL #3   Title ability to have 1-2 bowel movements per week with improved pelvic floor coordination and continued use of laxatives    Time 12    Period Weeks    Status New    Target Date 08/21/21                    Plan - 05/29/21 1030     Clinical Impression Statement Patient is a 51 year old female with constipation that has vbecome worse last year when she went into menopause. Prior to last year she would have a bowel movement 2 times per nmonth and one of those times was during her cycle. Now that she does not have a cycle she is not feeling the urge to have a bowel movement on her own. She is taking laxatives and will feel the urge to have a bowel  movement and does that every 3-4 days. Bilateral hip strength is 4/5. She has difficulty with coordinating her abdominal conatration. She has difficulty with diaphgramatic breathing to expand her abdomen and relax her pelvic floor. Abdomen has tightness and the diaphragm does not move well. Therapist is not able to insert her index fingerinto the anal canal but able to insert her pinky finger slowly to her PIP while monitoring for pain. REstrictions are located to the left and anterior anal canal. She has tightness in the posterior perineal body. Patient will benefit from skilled therapy to improve pelvic floor coordination and relaxation to have a bowel movement.    Personal Factors and Comorbidities Comorbidity 1;Fitness;Past/Current Experience    Comorbidities benign neuroendocrine tumor of rectum    Examination-Activity Limitations Toileting    Examination-Participation Restrictions Church    Stability/Clinical Decision Making Stable/Uncomplicated    Clinical Decision Making Low    Rehab Potential Excellent    PT Frequency 1x / week    PT Duration 12 weeks    PT Treatment/Interventions ADLs/Self Care Home Management;Biofeedback;Electrical Stimulation;Cryotherapy;Moist Heat;Functional mobility training;Therapeutic activities;Therapeutic exercise;Neuromuscular re-education;Patient/family education;Manual techniques;Dry needling    PT Next Visit Plan hip stretches; abdominal work, abdominal massage; trunk  rotation, manual work around the anus    PT Home Exercise Plan Access Code: [email or web address]    [email or web address] and Agree with Plan of Care Patient             Patient will benefit from skilled therapeutic intervention in order to improve the following deficits and impairments:  Impaired sensation, Decreased activity tolerance, Decreased strength, Increased fascial restricitons, Increased muscle spasms, Decreased coordination  Visit Diagnosis: Other lack of coordination - Plan: PT plan of care  cert/re-cert  Muscle weakness (generalized) - Plan: PT plan of care cert/re-cert     Problem List Patient Active Problem List   Diagnosis Date Noted   Palpitations 01/15/2021   Hx of adenomatous colonic polyps 10/03/2019   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

## 2021-06-05 ENCOUNTER — Other Ambulatory Visit: Payer: Self-pay

## 2021-06-05 ENCOUNTER — Encounter: Payer: Self-pay | Admitting: Physical Therapy

## 2021-06-05 ENCOUNTER — Encounter: Payer: 59 | Attending: Gastroenterology | Admitting: Physical Therapy

## 2021-06-05 DIAGNOSIS — R278 Other lack of coordination: Secondary | ICD-10-CM | POA: Diagnosis not present

## 2021-06-05 DIAGNOSIS — M6281 Muscle weakness (generalized): Secondary | ICD-10-CM | POA: Insufficient documentation

## 2021-06-05 NOTE — Patient Instructions (Signed)
Access Code: [email or web address] ?URL: [email or web address] ?Date: 06/05/2021 ?Prepared by: Earlie Counts ? ?Exercises ?SSeated Piriformis Stretch with Trunk Bend - 1 x daily - 7 x weekly - 1 sets - 2 reps - 30 sec hold ?Seated Hamstring Stretch - 1 x daily - 7 x weekly - 1 sets - 2 reps - 30 sec hold ?Side Lunge Adductor Stretch - 1 x daily - 7 x weekly - 1 sets - 2 reps - 30 sec hold ?Supine Piriformis Stretch with Leg Straight - 1 x daily - 7 x weekly - 1 sets - 2 reps - 30 sec hold ?Earlie Counts, PT ?[facility] ?[address], [address] ?[place], [place] [address] ?W: [phone] ?[email or web address] ? ? ?

## 2021-06-05 NOTE — Therapy (Signed)
[place] at [facility] [address], [place], [place], [address] Phone: [phone]   Fax:  [phone]  Physical Therapy Treatment  Patient Details  Name: Andrea Palmer MRN: [id number] Date of Birth: 05-19-1970 Referring Provider (PT): Dr. Thornton Park   Encounter Date: 06/05/2021   PT End of Session - 06/05/21 0933     Visit Number 2    Date for PT Re-Evaluation 08/21/21    Authorization Type Aetna    Authorization - Visit Number 2    Authorization - Number of Visits 35    PT Start Time 0930    PT Stop Time 1015    PT Time Calculation (min) 45 min    Activity Tolerance Patient tolerated treatment well    Behavior During Therapy [facility] for tasks assessed/performed             Past Medical History:  Diagnosis Date   Anxiety    HTN (hypertension)     Past Surgical History:  Procedure Laterality Date   BIOPSY  03/17/2021   Procedure: BIOPSY;  Surgeon: Irving Copas., MD;  Location: [facility];  Service: Gastroenterology;;   COLONOSCOPY     ENDOSCOPIC MUCOSAL RESECTION N/A 10/23/2019   Procedure: ENDOSCOPIC MUCOSAL RESECTION;  Surgeon: Irving Copas., MD;  Location: [place];  Service: Gastroenterology;  Laterality: N/A;   EUS N/A 10/23/2019   Procedure: LOWER ENDOSCOPIC ULTRASOUND (EUS);  Surgeon: Irving Copas., MD;  Location: [place];  Service: Gastroenterology;  Laterality: N/A;   EUS N/A 03/17/2021   Procedure: LOWER ENDOSCOPIC ULTRASOUND (EUS);  Surgeon: Irving Copas., MD;  Location: [facility];  Service: Gastroenterology;  Laterality: N/A;   FLEXIBLE SIGMOIDOSCOPY N/A 10/23/2019   Procedure: FLEXIBLE SIGMOIDOSCOPY;  Surgeon: Rush Landmark Telford Nab., MD;  Location: [place];  Service: Gastroenterology;  Laterality: N/A;   FLEXIBLE SIGMOIDOSCOPY N/A 03/17/2021   Procedure: FLEXIBLE SIGMOIDOSCOPY;  Surgeon: Rush Landmark Telford Nab., MD;  Location: [facility];   Service: Gastroenterology;  Laterality: N/A;   HEMOSTASIS CLIP PLACEMENT  10/23/2019   Procedure: HEMOSTASIS CLIP PLACEMENT;  Surgeon: Irving Copas., MD;  Location: [place];  Service: Gastroenterology;;   SUBMUCOSAL LIFTING INJECTION  10/23/2019   Procedure: SUBMUCOSAL LIFTING INJECTION;  Surgeon: Irving Copas., MD;  Location: [place];  Service: Gastroenterology;;   TUBAL LIGATION      There were no vitals filed for this visit.   Subjective Assessment - 06/05/21 0933     Subjective I felt better. I havemore gas released. The exercises are helping. Had a bowel movement the last time she was here.    Patient Stated Goals be able to have a bowel movement without taking laxatives    Currently in Pain? No/denies    Multiple Pain Sites No                               [facility] Adult PT Treatment/Exercise - 06/05/21 0001       Neuro Re-ed    Neuro Re-ed Details  sidely and supine working on lower rib cage expansion with tactile and verbal cues      Lumbar Exercises: Stretches   Active Hamstring Stretch Right;Left;1 rep;30 seconds    Active Hamstring Stretch Limitations 30 seconds    Lower Trunk Rotation 1 rep;30 seconds   right, left   Lower Trunk Rotation Limitations supine pull leg over    Piriformis Stretch Right;Left;1 rep;30 seconds  Piriformis Stretch Limitations sitting      Lumbar Exercises: Supine   Ab Set 5 reps;1 second    AB Set Limitations with diaphragamtic breathing      Manual Therapy   Manual Therapy Soft tissue mobilization;Myofascial release    Soft tissue mobilization manual work to the obliques and diaphragm to elongate the tissue; sidely with breath to work the diaphragm with hands up into the rib cage    Myofascial Release using a suction cup to the upper and lower abdominals to improve the mobility;release of the midline of the abdomen; quadruped preitoneal release; mesenteric root release with leg movement,  release suprapubically wiht lower extermity movement                     PT Education - 06/05/21 1018     Education Details Access Code: [email or web address]; sent patient you tube video on abdominal massage by Pecola Leisure    Person(s) Educated Patient    Methods Explanation;Demonstration;Verbal cues;Handout    Comprehension Returned demonstration;Verbalized understanding              PT Short Term Goals - 05/29/21 1046       PT SHORT TERM GOAL #1   Title independent with initial HEP with ip stretches and diaphragmatic breathing    Time 4    Period Weeks    Status New    Target Date 06/26/21      PT SHORT TERM GOAL #2   Title ability to bulge the pelvic floor due to relaxation of the pelvic floor    Time 4    Period Weeks    Status New    Target Date 06/26/21      PT SHORT TERM GOAL #3   Title understand how to perform abdominal massage to improve peristalic motion of the intestines to promote bowel movements    Time 4    Period Weeks    Status New    Target Date 06/26/21               PT Long Term Goals - 05/29/21 1048       PT LONG TERM GOAL #1   Title Independent with advanced HEP for core and hip strength    Time 12    Period Weeks    Status New    Target Date 08/21/21      PT LONG TERM GOAL #2   Title ability to demonstrate correct toileting to expand the anus with diaphragmatic breathing and push the stool out    Time 12    Period Weeks    Status New    Target Date 08/21/21      PT LONG TERM GOAL #3   Title ability to have 1-2 bowel movements per week with improved pelvic floor coordination and continued use of laxatives    Time 12    Period Weeks    Status New    Target Date 08/21/21                   Plan - 06/05/21 1018     Clinical Impression Statement Patient has not had a bowel movement since 05/29/2021. She has fascial tightness in the upper and lower abdomen. After manual work she was able to expand the abdomen  equally and contract with air out for the first time. Bowel sounds were heard with manual work. Patient had less bloating after the manual work. Patient will benefit from skilled therapy  to improve pelvic floor coordination and relaxation to have a bowel movement.    Personal Factors and Comorbidities Comorbidity 1;Fitness;Past/Current Experience    Comorbidities benign neuroendocrine tumor of rectum    Examination-Activity Limitations Toileting    Examination-Participation Restrictions Church    Stability/Clinical Decision Making Stable/Uncomplicated    Rehab Potential Excellent    PT Frequency 1x / week    PT Duration 12 weeks    PT Treatment/Interventions ADLs/Self Care Home Management;Biofeedback;Electrical Stimulation;Cryotherapy;Moist Heat;Functional mobility training;Therapeutic activities;Therapeutic exercise;Neuromuscular re-education;Patient/family education;Manual techniques;Dry needling    PT Next Visit Plan abdominal work, see if abdominal massage helps; manual work around the anus; abdominal contraction    PT Home Exercise Plan Access Code: [email or web address]    Recommended Other Services MD signed initial note    Consulted and Agree with Plan of Care Patient             Patient will benefit from skilled therapeutic intervention in order to improve the following deficits and impairments:  Impaired sensation, Decreased activity tolerance, Decreased strength, Increased fascial restricitons, Increased muscle spasms, Decreased coordination  Visit Diagnosis: Other lack of coordination  Muscle weakness (generalized)     Problem List Patient Active Problem List   Diagnosis Date Noted   Palpitations 01/15/2021   Hx of adenomatous colonic polyps 10/03/2019   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

## 2021-06-12 ENCOUNTER — Other Ambulatory Visit: Payer: Self-pay

## 2021-06-12 ENCOUNTER — Encounter: Payer: Self-pay | Admitting: Physical Therapy

## 2021-06-12 ENCOUNTER — Encounter: Payer: 59 | Admitting: Physical Therapy

## 2021-06-12 DIAGNOSIS — M6281 Muscle weakness (generalized): Secondary | ICD-10-CM | POA: Diagnosis not present

## 2021-06-12 DIAGNOSIS — R278 Other lack of coordination: Secondary | ICD-10-CM | POA: Diagnosis not present

## 2021-06-12 NOTE — Therapy (Signed)
[place] at [facility] [address], [place], [place], [address] Phone: [phone]   Fax:  [phone]  Physical Therapy Treatment  Patient Details  Name: Andrea Palmer MRN: [id number] Date of Birth: Aug 22, 1970 Referring Provider (PT): Dr. Thornton Park   Encounter Date: 06/12/2021   PT End of Session - 06/12/21 0933     Visit Number 3    Date for PT Re-Evaluation 08/21/21    Authorization Type Aetna    Authorization - Visit Number 3    Authorization - Number of Visits 35    PT Start Time 0930    PT Stop Time 1015    PT Time Calculation (min) 45 min    Activity Tolerance Patient tolerated treatment well    Behavior During Therapy [facility] for tasks assessed/performed             Past Medical History:  Diagnosis Date   Anxiety    HTN (hypertension)     Past Surgical History:  Procedure Laterality Date   BIOPSY  03/17/2021   Procedure: BIOPSY;  Surgeon: Irving Copas., MD;  Location: [facility];  Service: Gastroenterology;;   COLONOSCOPY     ENDOSCOPIC MUCOSAL RESECTION N/A 10/23/2019   Procedure: ENDOSCOPIC MUCOSAL RESECTION;  Surgeon: Irving Copas., MD;  Location: [place];  Service: Gastroenterology;  Laterality: N/A;   EUS N/A 10/23/2019   Procedure: LOWER ENDOSCOPIC ULTRASOUND (EUS);  Surgeon: Irving Copas., MD;  Location: [place];  Service: Gastroenterology;  Laterality: N/A;   EUS N/A 03/17/2021   Procedure: LOWER ENDOSCOPIC ULTRASOUND (EUS);  Surgeon: Irving Copas., MD;  Location: [facility];  Service: Gastroenterology;  Laterality: N/A;   FLEXIBLE SIGMOIDOSCOPY N/A 10/23/2019   Procedure: FLEXIBLE SIGMOIDOSCOPY;  Surgeon: Rush Landmark Telford Nab., MD;  Location: [place];  Service: Gastroenterology;  Laterality: N/A;   FLEXIBLE SIGMOIDOSCOPY N/A 03/17/2021   Procedure: FLEXIBLE SIGMOIDOSCOPY;  Surgeon: Rush Landmark Telford Nab., MD;  Location: [facility];   Service: Gastroenterology;  Laterality: N/A;   HEMOSTASIS CLIP PLACEMENT  10/23/2019   Procedure: HEMOSTASIS CLIP PLACEMENT;  Surgeon: Irving Copas., MD;  Location: [place];  Service: Gastroenterology;;   SUBMUCOSAL LIFTING INJECTION  10/23/2019   Procedure: SUBMUCOSAL LIFTING INJECTION;  Surgeon: Irving Copas., MD;  Location: [place];  Service: Gastroenterology;;   TUBAL LIGATION      There were no vitals filed for this visit.   Subjective Assessment - 06/12/21 0934     Subjective 2 days since I had a bowel movement. Patient has had 3 bowel movements since last visit.    Patient Stated Goals be able to have a bowel movement without taking laxatives    Currently in Pain? No/denies    Multiple Pain Sites No                               [facility] Adult PT Treatment/Exercise - 06/12/21 0001       Self-Care   Self-Care Other Self-Care Comments    Other Self-Care Comments  educated patient on how to massage around the anus and gluteals to relax the muscles for a bowel movement      Lumbar Exercises: Sidelying   Other Sidelying Lumbar Exercises open book 10x each side with theraist helping at end range to open up the rib cage      Lumbar Exercises: Quadruped   Other Quadruped Lumbar Exercises thread the needle then bring arm  up to thesky to stretch the rib cage 10x each side      Knee/Hip Exercises: Standing   Hip Flexion Left;Right;1 set;10 reps    Hip Flexion Limitations with abdominal and pelvic floor contraciton                     PT Education - 06/12/21 1021     Education Details Access Code: [email or web address]    Person(s) Educated Patient    Methods Explanation;Demonstration;Verbal cues;Handout    Comprehension Returned demonstration;Verbalized understanding              PT Short Term Goals - 06/12/21 1021       PT SHORT TERM GOAL #1   Title independent with initial HEP with ip stretches and diaphragmatic breathing     Time 4    Period Weeks    Status Achieved      PT SHORT TERM GOAL #2   Title ability to bulge the pelvic floor due to relaxation of the pelvic floor    Time 4    Period Weeks    Status On-going      PT SHORT TERM GOAL #3   Title understand how to perform abdominal massage to improve peristalic motion of the intestines to promote bowel movements    Time 4    Period Weeks    Status Achieved    Target Date 06/26/21               PT Long Term Goals - 05/29/21 1048       PT LONG TERM GOAL #1   Title Independent with advanced HEP for core and hip strength    Time 12    Period Weeks    Status New    Target Date 08/21/21      PT LONG TERM GOAL #2   Title ability to demonstrate correct toileting to expand the anus with diaphragmatic breathing and push the stool out    Time 12    Period Weeks    Status New    Target Date 08/21/21      PT LONG TERM GOAL #3   Title ability to have 1-2 bowel movements per week with improved pelvic floor coordination and continued use of laxatives    Time 12    Period Weeks    Status New    Target Date 08/21/21                   Plan - 06/12/21 1003     Clinical Impression Statement Patient was able to have a bowl movement 3 times in one week. She continues to have fascial restrictions in the abdomen and limited lower rib cage mobility. Good bowel sounds were heard during treatment. She is able to contract her abdomen better and able to engage the lower abdomen. Patient was able to feel the pelvic floor contraction. Patient will benefit from skilled therapy to improve pelvic floor coordination and relaxation to have a bowel movement.    Personal Factors and Comorbidities Comorbidity 1;Fitness;Past/Current Experience    Comorbidities benign neuroendocrine tumor of rectum    Examination-Activity Limitations Toileting    Examination-Participation Restrictions Church    Stability/Clinical Decision Making Stable/Uncomplicated     Rehab Potential Excellent    PT Frequency 1x / week    PT Duration 12 weeks    PT Treatment/Interventions ADLs/Self Care Home Management;Biofeedback;Electrical Stimulation;Cryotherapy;Moist Heat;Functional mobility training;Therapeutic activities;Therapeutic exercise;Neuromuscular re-education;Patient/family education;Manual techniques;Dry needling    PT  Next Visit Plan abdominal work, see if abdominal massage helps; manual work around the anus; abdominal contraction; bulging the pelvic floor    PT Home Exercise Plan Access Code: [email or web address]    [email or web address] and Agree with Plan of Care Patient             Patient will benefit from skilled therapeutic intervention in order to improve the following deficits and impairments:  Impaired sensation, Decreased activity tolerance, Decreased strength, Increased fascial restricitons, Increased muscle spasms, Decreased coordination  Visit Diagnosis: Other lack of coordination  Muscle weakness (generalized)     Problem List Patient Active Problem List   Diagnosis Date Noted   Palpitations 01/15/2021   Hx of adenomatous colonic polyps 10/03/2019   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

## 2021-06-12 NOTE — Patient Instructions (Signed)
Access Code: [email or web address] ?URL: [email or web address] ?Date: 06/12/2021 ?Prepared by: Earlie Counts ? ?Exercises ?Quadruped Full Range Thoracic Rotation with Reach - 1 x daily - 7 x weekly - 1 sets - 10 reps ?Sidelying Open Book Thoracic Lumbar Rotation and Extension - 1 x daily - 7 x weekly - 1 sets - 10 reps ?Earlie Counts, PT ?[facility] ?[address], [address] ?[place],  [address] ?W: [phone] ?[email or web address] ? ? ?

## 2021-06-19 ENCOUNTER — Other Ambulatory Visit: Payer: Self-pay

## 2021-06-19 ENCOUNTER — Ambulatory Visit: Payer: 59 | Admitting: Physical Therapy

## 2021-06-19 ENCOUNTER — Encounter: Payer: Self-pay | Admitting: Physical Therapy

## 2021-06-19 DIAGNOSIS — R278 Other lack of coordination: Secondary | ICD-10-CM | POA: Diagnosis not present

## 2021-06-19 DIAGNOSIS — M6281 Muscle weakness (generalized): Secondary | ICD-10-CM | POA: Diagnosis not present

## 2021-06-19 NOTE — Therapy (Signed)
[place] ?[facility] at [facility] ?[address], [address] ?[place], [place], [address] ?Phone: [phone]   Fax:  [phone] ? ?Physical Therapy Treatment ? ?Patient Details  ?Name: Andrea Palmer ?MRN: [id number] ?Date of Birth: 03-Dec-1970 ?Referring Provider (PT): Dr. [place] Park ? ? ?Encounter Date: 06/19/2021 ? ? PT End of Session - 06/19/21 0930   ? ? Visit Number 4   ? Date for PT Re-Evaluation 08/21/21   ? Authorization Type Aetna   ? Authorization - Visit Number 4   ? Authorization - Number of Visits 35   ? PT Start Time 0930   ? PT Stop Time 1015   ? PT Time Calculation (min) 45 min   ? Activity Tolerance Patient tolerated treatment well   ? Behavior During Therapy [facility] for tasks assessed/performed   ? ?  ?  ? ?  ? ? ?Past Medical History:  ?Diagnosis Date  ? Anxiety   ? HTN (hypertension)   ? ? ?Past Surgical History:  ?Procedure Laterality Date  ? BIOPSY  03/17/2021  ? Procedure: BIOPSY;  Surgeon: Irving Copas., MD;  Location: [facility];  Service: Gastroenterology;;  ? COLONOSCOPY    ? ENDOSCOPIC MUCOSAL RESECTION N/A 10/23/2019  ? Procedure: ENDOSCOPIC MUCOSAL RESECTION;  Surgeon: Rush Landmark Telford Nab., MD;  Location: [place];  Service: Gastroenterology;  Laterality: N/A;  ? EUS N/A 10/23/2019  ? Procedure: LOWER ENDOSCOPIC ULTRASOUND (EUS);  Surgeon: Irving Copas., MD;  Location: [place];  Service: Gastroenterology;  Laterality: N/A;  ? EUS N/A 03/17/2021  ? Procedure: LOWER ENDOSCOPIC ULTRASOUND (EUS);  Surgeon: Irving Copas., MD;  Location: [facility];  Service: Gastroenterology;  Laterality: N/A;  ? FLEXIBLE SIGMOIDOSCOPY N/A 10/23/2019  ? Procedure: FLEXIBLE SIGMOIDOSCOPY;  Surgeon: Irving Copas., MD;  Location: [place];  Service: Gastroenterology;  Laterality: N/A;  ? FLEXIBLE SIGMOIDOSCOPY N/A 03/17/2021  ? Procedure: FLEXIBLE SIGMOIDOSCOPY;  Surgeon: Irving Copas., MD;  Location: [facility];   Service: Gastroenterology;  Laterality: N/A;  ? HEMOSTASIS CLIP PLACEMENT  10/23/2019  ? Procedure: HEMOSTASIS CLIP PLACEMENT;  Surgeon: Irving Copas., MD;  Location: [place];  Service: Gastroenterology;;  ? SUBMUCOSAL LIFTING INJECTION  10/23/2019  ? Procedure: SUBMUCOSAL LIFTING INJECTION;  Surgeon: Irving Copas., MD;  Location: [place];  Service: Gastroenterology;;  ? TUBAL LIGATION    ? ? ?There were no vitals filed for this visit. ? ? Subjective Assessment - 06/19/21 0930   ? ? Subjective When go to the bathroom pain is 50% less pain. I had a bowel movement but need to take laxatives.   ? Patient Stated Goals be able to have a bowel movement without taking laxatives   ? Currently in Pain? Yes   ? Pain Score 5    ? Pain Location Rectum   ? Pain Orientation Mid   ? Pain Descriptors / Indicators Discomfort;Throbbing   ? Pain Type Chronic pain   ? Pain Onset More than a month ago   ? Pain Frequency Intermittent   ? Aggravating Factors  when having a bowel movement   ? Pain Relieving Factors not having a bowel movement   ? Multiple Pain Sites No   ? ?  ?  ? ?  ? ? ? ? ? ? ? ? ? ? ? ? ? ? ? ? ? Pelvic Floor Special Questions - 06/19/21 0001   ? ? Pelvic Floor Internal Exam Patient comfirms identification and approves PT to assess pelvic floor  and treatment   ? Exam Type Rectal   ? Palpation at first therapist had difficulty with placing her index finger into the anus but after she bulged thepelvic floro she was able to relax   ? ?  ?  ? ?  ? ? ? ? [place] Adult PT Treatment/Exercise - 06/19/21 0001   ? ?  ? Self-Care  ? Self-Care Other Self-Care Comments   ? Other Self-Care Comments  educated patient on moving her hips around to relax the pelvic floro, massage the anus prior to bowel movement,   ?  ? Lumbar Exercises: Seated  ? Other Seated Lumbar Exercises sit on ball and bulge the pelvic floor for a reverse kegel with breath, sit on ball and move hips in circles, side to side, ant and  post. tilt   ?  ? Lumbar Exercises: Supine  ? Isometric Hip Flexion 10 reps;1 second   right, left  ? Isometric Hip Flexion Limitations keeping the abdominals contracted   ?  ? Lumbar Exercises: Quadruped  ? Other Quadruped Lumbar Exercises bear plank holding for 5 seconds 5 times   ?  ? Manual Therapy  ? Manual Therapy Internal Pelvic Floor   ? Internal Pelvic Floor using the [employer] performing manual releas of the coccygeus, iliococcygeus, anococcygeal ligament and sphincters with contract relax of the left hip and patient bulging the pelvic floor   ? ?  ?  ? ?  ? ? ? ? ? ? ? ? ? ? PT Education - 06/19/21 1014   ? ? Education Details Access Code: [email or web address]; educated on bulging the pelvic floor, massage anus prior to bowel movement   ? Person(s) Educated Patient   ? Methods Explanation;Demonstration;Verbal cues;Handout   ? Comprehension Returned demonstration;Verbalized understanding   ? ?  ?  ? ?  ? ? ? PT Short Term Goals - 06/19/21 1019   ? ?  ? PT SHORT TERM GOAL #1  ? Title independent with initial HEP with ip stretches and diaphragmatic breathing   ? Time 4   ? Period Weeks   ? Status Achieved   ?  ? PT SHORT TERM GOAL #2  ? Title ability to bulge the pelvic floor due to relaxation of the pelvic floor   ? Time 4   ? Period Weeks   ? Status Achieved   ?  ? PT SHORT TERM GOAL #3  ? Title understand how to perform abdominal massage to improve peristalic motion of the intestines to promote bowel movements   ? Time 4   ? Period Weeks   ? Status Achieved   ? ?  ?  ? ?  ? ? ? ? PT Long Term Goals - 05/29/21 1048   ? ?  ? PT LONG TERM GOAL #1  ? Title Independent with advanced HEP for core and hip strength   ? Time 12   ? Period Weeks   ? Status New   ? Target Date 08/21/21   ?  ? PT LONG TERM GOAL #2  ? Title ability to demonstrate correct toileting to expand the anus with diaphragmatic breathing and push the stool out   ? Time 12   ? Period Weeks   ? Status New   ? Target Date 08/21/21   ?  ? PT LONG  TERM GOAL #3  ? Title ability to have 1-2 bowel movements per week with improved pelvic floor coordination and continued use of laxatives   ?  Time 12   ? Period Weeks   ? Status New   ? Target Date 08/21/21   ? ?  ?  ? ?  ? ? ? ? ? ? ? ? Plan - 06/19/21 1015   ? ? Clinical Impression Statement Patient reports she has 50% less pain with bowel movements. She was able to bulge the pelvic floor and push the therapist finger out of the anal canal. Patient had trigger points in bilateral iliococcygeus and coccygeus. She was able to contract her lower abdomen. Patient had not pain after the internal manual work. She had 2 bowel movements since last visit. Patient will benefit from skilled therapy to improve pelvic floor coordinaiton and relaxation to have a bowel movement.   ? Personal Factors and Comorbidities Comorbidity 1;Fitness;Past/Current Experience   ? Comorbidities benign neuroendocrine tumor of rectum   ? Examination-Activity Limitations Toileting   ? Examination-Participation Restrictions Church   ? Stability/Clinical Decision Making Stable/Uncomplicated   ? Rehab Potential Excellent   ? PT Frequency 1x / week   ? PT Duration 12 weeks   ? PT Treatment/Interventions ADLs/Self Care Home Management;Biofeedback;Electrical Stimulation;Cryotherapy;Moist Heat;Functional mobility training;Therapeutic activities;Therapeutic exercise;Neuromuscular re-education;Patient/family education;Manual techniques;Dry needling   ? PT Next Visit Plan see if anal work needs to be done, sit on ball to bulge the pelvic floor and do pelvis movements, pigeon pose, Quadruped lift extremity, dead bug with band around knees, 1/2 kneel pallof   ? PT Home Exercise Plan Access Code: [email or web address]   ? Consulted and Agree with Plan of Care Patient   ? ?  ?  ? ?  ? ? ?Patient will benefit from skilled therapeutic intervention in order to improve the following deficits and impairments:  Impaired sensation, Decreased activity tolerance, Decreased  strength, Increased fascial restricitons, Increased muscle spasms, Decreased coordination ? ?Visit Diagnosis: ?Other lack of coordination ? ?Muscle weakness (generalized) ? ? ? ? ?Problem List ?Patient Active P

## 2021-06-19 NOTE — Patient Instructions (Signed)
Access Code: [email or web address] ?URL: [email or web address] ?Date: 06/19/2021 ?Prepared by: Earlie Counts ? ?Exercises ?SHooklying Isometric Hip Flexion - 1 x daily - 3 x weekly - 2 sets - 10 reps ?Bear Plank from [place] - 1 x daily - 7 x weekly - 1 sets - 5 reps - 5 sec hold ?Earlie Counts, PT ?[facility] ?[address], [address] ?[place], [place] [address] ?W: [phone] ?[email or web address] ? ?

## 2021-06-26 ENCOUNTER — Encounter: Payer: 59 | Admitting: Physical Therapy

## 2021-06-26 ENCOUNTER — Other Ambulatory Visit: Payer: Self-pay

## 2021-06-26 ENCOUNTER — Encounter: Payer: Self-pay | Admitting: Physical Therapy

## 2021-06-26 DIAGNOSIS — R278 Other lack of coordination: Secondary | ICD-10-CM | POA: Diagnosis not present

## 2021-06-26 DIAGNOSIS — M6281 Muscle weakness (generalized): Secondary | ICD-10-CM | POA: Diagnosis not present

## 2021-06-26 NOTE — Patient Instructions (Addendum)
?  Access Code: [email or web address] ?URL: [email or web address] ?Date: 06/26/2021 ?Prepared by: Earlie Counts ? ?Exercises ?-- Joanna Hews from Quadruped  - 1 x daily - 7 x weekly - 1 sets - 5 reps - 5 sec hold ?- Yoga Squat for Pelvic Floor Relaxation with Yoga Block  - 1 x daily - 7 x weekly - 1 sets - 1 reps - 1 min hold ?- Supine March with Resistance Band  - 1 x daily - 3 x weekly - 2 sets - 10 reps ?Earlie Counts, PT ?[facility] ?[address], [address] ?[place], [place] [address] ?W: [phone] ?[email or web address] ? ?

## 2021-06-26 NOTE — Therapy (Signed)
[place] ?[facility] ?[address], [address] ?[place], [place], [address] ?Phone: [phone]   Fax:  [phone] ? ?Physical Therapy Treatment ? ?Patient Details  ?Name: Andrea Palmer ?MRN: [id number] ?Date of Birth: Mar 06, 1971 ?Referring Provider (PT): Dr. Thornton Park ? ? ?Encounter Date: 06/26/2021 ? ? PT End of Session - 06/26/21 1453   ? ? Visit Number 5   ? Date for PT Re-Evaluation 08/21/21   ? Authorization Type Aetna   ? Authorization - Visit Number 5   ? Authorization - Number of Visits 35   ? PT Start Time 1400   ? PT Stop Time 0300   ? PT Time Calculation (min) 45 min   ? Activity Tolerance Patient tolerated treatment well   ? Behavior During Therapy [facility] for tasks assessed/performed   ? ?  ?  ? ?  ? ? ?Past Medical History:  ?Diagnosis Date  ? Anxiety   ? HTN (hypertension)   ? ? ?Past Surgical History:  ?Procedure Laterality Date  ? BIOPSY  03/17/2021  ? Procedure: BIOPSY;  Surgeon: Irving Copas., MD;  Location: [facility];  Service: Gastroenterology;;  ? COLONOSCOPY    ? ENDOSCOPIC MUCOSAL RESECTION N/A 10/23/2019  ? Procedure: ENDOSCOPIC MUCOSAL RESECTION;  Surgeon: Rush Landmark Telford Nab., MD;  Location: [place];  Service: Gastroenterology;  Laterality: N/A;  ? EUS N/A 10/23/2019  ? Procedure: LOWER ENDOSCOPIC ULTRASOUND (EUS);  Surgeon: Irving Copas., MD;  Location: [place];  Service: Gastroenterology;  Laterality: N/A;  ? EUS N/A 03/17/2021  ? Procedure: LOWER ENDOSCOPIC ULTRASOUND (EUS);  Surgeon: Irving Copas., MD;  Location: [facility];  Service: Gastroenterology;  Laterality: N/A;  ? FLEXIBLE SIGMOIDOSCOPY N/A 10/23/2019  ? Procedure: FLEXIBLE SIGMOIDOSCOPY;  Surgeon: Irving Copas., MD;  Location: [place];  Service: Gastroenterology;  Laterality: N/A;  ? FLEXIBLE SIGMOIDOSCOPY N/A 03/17/2021  ? Procedure: FLEXIBLE SIGMOIDOSCOPY;  Surgeon: Irving Copas., MD;  Location: [facility];   Service: Gastroenterology;  Laterality: N/A;  ? HEMOSTASIS CLIP PLACEMENT  10/23/2019  ? Procedure: HEMOSTASIS CLIP PLACEMENT;  Surgeon: Irving Copas., MD;  Location: [place];  Service: Gastroenterology;;  ? SUBMUCOSAL LIFTING INJECTION  10/23/2019  ? Procedure: SUBMUCOSAL LIFTING INJECTION;  Surgeon: Irving Copas., MD;  Location: [place];  Service: Gastroenterology;;  ? TUBAL LIGATION    ? ? ?There were no vitals filed for this visit. ? ? Subjective Assessment - 06/26/21 1401   ? ? Subjective I sitll have to have a laxative. Exercises help with stretch. Stomach does not feel bloated all the time. Pain only happens when I do not use the cream prior to bowel movement. After going to the bathroom my rectum feels like it is coming down. No straining to have a bowel movement. Now after a bowel movement I feel the muscle in the rectum work.   ? Patient Stated Goals be able to have a bowel movement without taking laxatives   ? Currently in Pain? Yes   ? Pain Score 5    ? Pain Location Rectum   ? Pain Orientation Mid   ? Pain Descriptors / Indicators Discomfort;Throbbing   ? Pain Type Chronic pain   ? Pain Onset More than a month ago   ? Pain Frequency Intermittent   ? Aggravating Factors  when having a bowel movement   ? Pain Relieving Factors not having a bowel movement and using the cream to numb the area   ? Multiple Pain  Sites No   ? ?  ?  ? ?  ? ? ? ? ? ? ? ? ? ? ? ? ? ? ? ? ? Pelvic Floor Special Questions - 06/26/21 0001   ? ? Exam Type Deferred   ? ?  ?  ? ?  ? ? ? ? [place] Adult PT Treatment/Exercise - 06/26/21 0001   ? ?  ? Self-Care  ? Self-Care Other Self-Care Comments   ? Other Self-Care Comments  educated patient on contracting the rectum 5 times after having a bowel movement so she does not feel like she has to continue pushing the stool out that is not there.   ?  ? Lumbar Exercises: Stretches  ? Piriformis Stretch Right;Left;1 rep   ? Piriformis Stretch Limitations pigeon pose    ?  ? Lumbar Exercises: Seated  ? Other Seated Lumbar Exercises sit on yoga block with breathing into stomach to expand the pelvic floor to stretch the area   ?  ? Knee/Hip Exercises: Standing  ? Other Standing Knee Exercises foot on yoga block and bring opposite hand to the knee that is to straight and hike up the leg on the block 10x each side   ?  ? Manual Therapy  ? Manual Therapy Myofascial release   ? Myofascial Release fascial release around the abdomen where there are fascial restrictions to improve tissue mobility and and intestinal movement   ? ?  ?  ? ?  ? ? ? ? ? ? ? ? ? ? PT Education - 06/26/21 1447   ? ? Education Details Access Code: [email or web address]   ? Person(s) Educated Patient   ? Methods Explanation;Demonstration;Verbal cues;Handout   ? Comprehension Returned demonstration;Verbalized understanding   ? ?  ?  ? ?  ? ? ? PT Short Term Goals - 06/19/21 1019   ? ?  ? PT SHORT TERM GOAL #1  ? Title independent with initial HEP with ip stretches and diaphragmatic breathing   ? Time 4   ? Period Weeks   ? Status Achieved   ?  ? PT SHORT TERM GOAL #2  ? Title ability to bulge the pelvic floor due to relaxation of the pelvic floor   ? Time 4   ? Period Weeks   ? Status Achieved   ?  ? PT SHORT TERM GOAL #3  ? Title understand how to perform abdominal massage to improve peristalic motion of the intestines to promote bowel movements   ? Time 4   ? Period Weeks   ? Status Achieved   ? ?  ?  ? ?  ? ? ? ? PT Long Term Goals - 06/26/21 1407   ? ?  ? PT LONG TERM GOAL #1  ? Title Independent with advanced HEP for core and hip strength   ? Time 12   ? Period Weeks   ? Status On-going   ?  ? PT LONG TERM GOAL #2  ? Title ability to demonstrate correct toileting to expand the anus with diaphragmatic breathing and push the stool out   ? Time 12   ? Period Weeks   ? Status On-going   ?  ? PT LONG TERM GOAL #3  ? Title ability to have 1-2 bowel movements per week with improved pelvic floor coordination and continued use of  laxatives   ? Baseline 2 bowel movements per week and continues with laxative.   ? Time 12   ?  Period Weeks   ? Status On-going   ? Target Date 08/21/21   ? ?  ?  ? ?  ? ? ? ? ? ? ? ? Plan - 06/26/21 1450   ? ? Clinical Impression Statement Patient is having 2 bowel movements per week instead of 0-1. Patient abdomen is not as bloated. She is able to contract the lower abdominals with greater ease and resistance. She continues to take the same amount of laxatives. Patient is doing yoga. Patient will benefi tfrom skiled therapy to improve pelvic floor coordination and relaxation to have a bowel movement.   ? Personal Factors and Comorbidities Comorbidity 1;Fitness;Past/Current Experience   ? Comorbidities benign neuroendocrine tumor of rectum   ? Examination-Activity Limitations Toileting   ? Examination-Participation Restrictions Church   ? Stability/Clinical Decision Making Stable/Uncomplicated   ? Rehab Potential Excellent   ? PT Frequency 1x / week   ? PT Duration 12 weeks   ? PT Treatment/Interventions ADLs/Self Care Home Management;Biofeedback;Electrical Stimulation;Cryotherapy;Moist Heat;Functional mobility training;Therapeutic activities;Therapeutic exercise;Neuromuscular re-education;Patient/family education;Manual techniques;Dry needling   ? PT Next Visit Plan abdominal work, review HEP   ? PT Home Exercise Plan Access Code: [email or web address]   ? Consulted and Agree with Plan of Care Patient   ? ?  ?  ? ?  ? ? ?Patient will benefit from skilled therapeutic intervention in order to improve the following deficits and impairments:  Impaired sensation, Decreased activity tolerance, Decreased strength, Increased fascial restricitons, Increased muscle spasms, Decreased coordination ? ?Visit Diagnosis: ?Other lack of coordination ? ?Muscle weakness (generalized) ? ? ? ? ?Problem List ?Patient Active Problem List  ? Diagnosis Date Noted  ? Palpitations 01/15/2021  ? Hx of adenomatous colonic polyps 10/03/2019  ? 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 Acute left flank pain 07/25/2019  ? Essential hypertension 05/16/2019  ? Generalized anxiety disorder 05/16/2019  ? Breast lump on left side at 3 o'clock position 02/05/2014

## 2021-07-03 ENCOUNTER — Encounter: Payer: 59 | Admitting: Physical Therapy

## 2021-07-03 ENCOUNTER — Encounter: Payer: Self-pay | Admitting: Physical Therapy

## 2021-07-03 DIAGNOSIS — M6281 Muscle weakness (generalized): Secondary | ICD-10-CM

## 2021-07-03 DIAGNOSIS — R278 Other lack of coordination: Secondary | ICD-10-CM

## 2021-07-03 NOTE — Therapy (Signed)
[place] ?[facility] at [facility] ?[address], [address] ?[place], [place], [address] ?Phone: [phone]   Fax:  [phone] ? ?Physical Therapy Treatment ? ?Patient Details  ?Name: Andrea Palmer ?MRN: [id number] ?Date of Birth: 04/11/1970 ?Referring Provider (PT): Dr. Thornton Park ? ? ?Encounter Date: 07/03/2021 ? ? PT End of Session - 07/03/21 1036   ? ? Visit Number 6   ? Date for PT Re-Evaluation 08/21/21   ? Authorization Type Aetna   ? Authorization - Visit Number 6   ? Authorization - Number of Visits 35   ? PT Start Time 1030   ? PT Stop Time 1115   ? PT Time Calculation (min) 45 min   ? Activity Tolerance Patient tolerated treatment well   ? Behavior During Therapy [facility] for tasks assessed/performed   ? ?  ?  ? ?  ? ? ?Past Medical History:  ?Diagnosis Date  ? Anxiety   ? HTN (hypertension)   ? ? ?Past Surgical History:  ?Procedure Laterality Date  ? BIOPSY  03/17/2021  ? Procedure: BIOPSY;  Surgeon: Irving Copas., MD;  Location: [facility];  Service: Gastroenterology;;  ? COLONOSCOPY    ? ENDOSCOPIC MUCOSAL RESECTION N/A 10/23/2019  ? Procedure: ENDOSCOPIC MUCOSAL RESECTION;  Surgeon: Rush Landmark Telford Nab., MD;  Location: [place];  Service: Gastroenterology;  Laterality: N/A;  ? EUS N/A 10/23/2019  ? Procedure: LOWER ENDOSCOPIC ULTRASOUND (EUS);  Surgeon: Irving Copas., MD;  Location: [place];  Service: Gastroenterology;  Laterality: N/A;  ? EUS N/A 03/17/2021  ? Procedure: LOWER ENDOSCOPIC ULTRASOUND (EUS);  Surgeon: Irving Copas., MD;  Location: [facility];  Service: Gastroenterology;  Laterality: N/A;  ? FLEXIBLE SIGMOIDOSCOPY N/A 10/23/2019  ? Procedure: FLEXIBLE SIGMOIDOSCOPY;  Surgeon: Irving Copas., MD;  Location: [place];  Service: Gastroenterology;  Laterality: N/A;  ? FLEXIBLE SIGMOIDOSCOPY N/A 03/17/2021  ? Procedure: FLEXIBLE SIGMOIDOSCOPY;  Surgeon: Irving Copas., MD;  Location: [facility];   Service: Gastroenterology;  Laterality: N/A;  ? HEMOSTASIS CLIP PLACEMENT  10/23/2019  ? Procedure: HEMOSTASIS CLIP PLACEMENT;  Surgeon: Irving Copas., MD;  Location: [place];  Service: Gastroenterology;;  ? SUBMUCOSAL LIFTING INJECTION  10/23/2019  ? Procedure: SUBMUCOSAL LIFTING INJECTION;  Surgeon: Irving Copas., MD;  Location: [place];  Service: Gastroenterology;;  ? TUBAL LIGATION    ? ? ?There were no vitals filed for this visit. ? ? Subjective Assessment - 07/03/21 1037   ? ? Subjective I am doing good. The rectal muscles are working a little. I still need laxatives. I have had 3 bowel movement since last visit. Rectal pain is 4/10 and 90% better. Patient is able to pass more gas.   ? Patient Stated Goals be able to have a bowel movement without taking laxatives   ? Currently in Pain? Yes   ? Pain Score 4    ? Pain Location Rectum   ? Pain Orientation Mid   ? Pain Descriptors / Indicators Discomfort;Throbbing   ? Pain Type Chronic pain   ? Pain Onset More than a month ago   ? Pain Frequency Intermittent   ? Aggravating Factors  when having a bowel movement   ? Pain Relieving Factors not having a bowel movement   ? Multiple Pain Sites No   ? ?  ?  ? ?  ? ? ? ? ? [facility] PT Assessment - 07/03/21 0001   ? ?  ? Assessment  ? Medical Diagnosis D3A.8 [place]  neuroendocrine tumor of rectum; K59.00 Constipation, unspecified constipation type   ? Referring Provider (PT) Dr. Thornton Park   ? Onset Date/Surgical Date 05/29/20   ? Prior Therapy none   ?  ? Precautions  ? Precautions None   ?  ? Restrictions  ? Weight Bearing Restrictions No   ?  ? Home Environment  ? Living Environment Private residence   ?  ? Prior Function  ? Level of Independence Independent   ? Vocation Part time employment   ? Vocation Requirements Hair stylist, standing   ? Leisure walk on the treadmill, yoga,   ?  ? Cognition  ? Overall Cognitive Status Within Functional Limits for tasks assessed   ?  ? AROM  ? Overall  AROM Comments full lumbar ROM   ?  ? Strength  ? Overall Strength Comments able to contract abdominals correctly   ? Right Hip Extension 4+/5   ? Right Hip ABduction 4+/5   ? Right Hip ADduction 4+/5   ? Left Hip Extension 4+/5   ? Left Hip ABduction 4+/5   ? Left Hip ADduction 4+/5   ? ?  ?  ? ?  ? ? ? ? ? ? ? ? ? ? ? ? ? Pelvic Floor Special Questions - 07/03/21 0001   ? ? Number of Pregnancies 2   ? Number of Vaginal Deliveries 2   tore a little with the first child  ? Currently Sexually Active Yes   ? Is this Painful No   ? Urinary Leakage No   ? Fecal incontinence No   ? Exam Type Deferred   ? ?  ?  ? ?  ? ? ? ? [place] Adult PT Treatment/Exercise - 07/03/21 0001   ? ?  ? Self-Care  ? Self-Care Other Self-Care Comments   ? Other Self-Care Comments  discussed with patient on when to take the Linzess to see if it works differently and how she is taking the laxatives.   ?  ? Lumbar Exercises: Stretches  ? Press Ups 5 reps   ? Quadruped Mid Back Stretch 60 seconds;3 reps   ? Quadruped Mid Back Stretch Limitations childs pose then go to the right and left   ? Piriformis Stretch Right;Left;1 rep   ? Piriformis Stretch Limitations pigeon pose   ? Other Lumbar Stretch Exercise prone lunge with foot outside hands and rock knee back and forth   ? Other Lumbar Stretch Exercise squat on yoga block with diaphragmatic brathing to relax the pelvic floor   ?  ? Lumbar Exercises: Quadruped  ? Madcat/Old Horse 10 reps   ? Other Quadruped Lumbar Exercises quadruped with bottom going side to side.; followed by downward dog and walk thedog   ? Other Quadruped Lumbar Exercises quadruped with knee on yoga block and shift onto the knee to open up the posterior hip, then lift the hip up and down to level out the pelvis and work the multifidiand needed tactile cues to move correctly   ? ?  ?  ? ?  ? ? ? ? ? ? ? ? ? ? ? ? PT Short Term Goals - 07/03/21 1039   ? ?  ? PT SHORT TERM GOAL #1  ? Title independent with initial HEP with ip  stretches and diaphragmatic breathing   ? Time 4   ? Period Weeks   ? Status Achieved   ? Target Date 06/26/21   ?  ? PT SHORT TERM  GOAL #2  ? Title ability to bulge the pelvic floor due to relaxation of the pelvic floor   ? Time 4   ? Period Weeks   ? Status Achieved   ? Target Date 06/26/21   ?  ? PT SHORT TERM GOAL #3  ? Title understand how to perform abdominal massage to improve peristalic motion of the intestines to promote bowel movements   ? Time 4   ? Period Weeks   ? Status Achieved   ? Target Date 06/26/21   ? ?  ?  ? ?  ? ? ? ? PT Long Term Goals - 07/03/21 1039   ? ?  ? PT LONG TERM GOAL #1  ? Title Independent with advanced HEP for core and hip strength   ? Time 12   ? Period Weeks   ? Status Achieved   ? Target Date 08/21/21   ?  ? PT LONG TERM GOAL #2  ? Title ability to demonstrate correct toileting to expand the anus with diaphragmatic breathing and push the stool out   ? Time 12   ? Period Weeks   ? Status Achieved   ? Target Date 08/21/21   ?  ? PT LONG TERM GOAL #3  ? Title ability to have 1-2 bowel movements per week with improved pelvic floor coordination and continued use of laxatives   ? Baseline 2 bowel movements per week and continues with laxative.   ? Time 12   ? Period Weeks   ? Status Achieved   ? Target Date 08/21/21   ? ?  ?  ? ?  ? ? ? ? ? ? ? ? Plan - 07/03/21 1120   ? ? Clinical Impression Statement Patient is able to have 3 bowel movement per week with 90% less pain and does not strain. She continues to take laxatives. She has increased mobility of the abdominal scars and less bloating in the abdomen. She is able ot contract her abdomen correctly. She has increased tissue mobility of the levator ani. Patient is doing yoga and independent with her current HEP. She is rady for discharge and happy with her progress.   ? Personal Factors and Comorbidities Comorbidity 1;Fitness;Past/Current Experience   ? Comorbidities benign neuroendocrine tumor of rectum   ? Examination-Activity  Limitations Toileting   ? Examination-Participation Restrictions Church   ? Stability/Clinical Decision Making Stable/Uncomplicated   ? Rehab Potential Excellent   ? PT Treatment/Interventions ADLs/Self Care

## 2021-07-14 DIAGNOSIS — E559 Vitamin D deficiency, unspecified: Secondary | ICD-10-CM | POA: Diagnosis not present

## 2021-07-17 ENCOUNTER — Encounter: Payer: Self-pay | Admitting: Physical Therapy

## 2021-07-31 ENCOUNTER — Encounter: Payer: Self-pay | Admitting: Physical Therapy

## 2021-08-13 DIAGNOSIS — I10 Essential (primary) hypertension: Secondary | ICD-10-CM | POA: Diagnosis not present

## 2022-01-14 DIAGNOSIS — Z124 Encounter for screening for malignant neoplasm of cervix: Secondary | ICD-10-CM | POA: Diagnosis not present

## 2022-01-14 DIAGNOSIS — Z0000 Encounter for general adult medical examination without abnormal findings: Secondary | ICD-10-CM | POA: Diagnosis not present

## 2022-01-14 DIAGNOSIS — Z1159 Encounter for screening for other viral diseases: Secondary | ICD-10-CM | POA: Diagnosis not present

## 2022-01-14 DIAGNOSIS — E7800 Pure hypercholesterolemia, unspecified: Secondary | ICD-10-CM | POA: Diagnosis not present

## 2022-01-14 DIAGNOSIS — E559 Vitamin D deficiency, unspecified: Secondary | ICD-10-CM | POA: Diagnosis not present

## 2022-01-15 ENCOUNTER — Other Ambulatory Visit: Payer: Self-pay | Admitting: Family Medicine

## 2022-01-15 DIAGNOSIS — Z1231 Encounter for screening mammogram for malignant neoplasm of breast: Secondary | ICD-10-CM

## 2022-02-17 DIAGNOSIS — E876 Hypokalemia: Secondary | ICD-10-CM | POA: Diagnosis not present

## 2022-03-04 ENCOUNTER — Ambulatory Visit
Admission: RE | Admit: 2022-03-04 | Discharge: 2022-03-04 | Disposition: A | Discharge status: Home / Self Care | Payer: 59 | Source: Ambulatory Visit | Attending: Family Medicine | Admitting: Family Medicine

## 2022-03-04 DIAGNOSIS — Z111 Encounter for screening for respiratory tuberculosis: Secondary | ICD-10-CM | POA: Diagnosis not present

## 2022-03-04 DIAGNOSIS — Z1231 Encounter for screening mammogram for malignant neoplasm of breast: Secondary | ICD-10-CM | POA: Diagnosis not present

## 2022-03-05 ENCOUNTER — Other Ambulatory Visit: Payer: Self-pay | Admitting: Family Medicine

## 2022-03-05 DIAGNOSIS — R928 Other abnormal and inconclusive findings on diagnostic imaging of breast: Secondary | ICD-10-CM

## 2022-03-11 DIAGNOSIS — E876 Hypokalemia: Secondary | ICD-10-CM | POA: Diagnosis not present

## 2022-03-18 ENCOUNTER — Ambulatory Visit
Admission: RE | Admit: 2022-03-18 | Discharge: 2022-03-18 | Disposition: A | Discharge status: Home / Self Care | Payer: 59 | Source: Ambulatory Visit | Attending: Family Medicine | Admitting: Family Medicine

## 2022-03-18 ENCOUNTER — Ambulatory Visit: Payer: 59

## 2022-03-18 ENCOUNTER — Other Ambulatory Visit: Payer: Self-pay | Admitting: Family Medicine

## 2022-03-18 DIAGNOSIS — N6310 Unspecified lump in the right breast, unspecified quadrant: Secondary | ICD-10-CM

## 2022-03-18 DIAGNOSIS — R928 Other abnormal and inconclusive findings on diagnostic imaging of breast: Secondary | ICD-10-CM

## 2022-03-18 DIAGNOSIS — N6489 Other specified disorders of breast: Secondary | ICD-10-CM | POA: Diagnosis not present

## 2022-04-07 DIAGNOSIS — E876 Hypokalemia: Secondary | ICD-10-CM | POA: Diagnosis not present

## 2022-04-17 ENCOUNTER — Telehealth: Payer: Self-pay | Admitting: Gastroenterology

## 2022-04-17 NOTE — Telephone Encounter (Signed)
Patient's last office visit was 1/31, which is within a year.

## 2022-04-17 NOTE — Telephone Encounter (Signed)
Please call patient back. She has not been seen in a year and will need an office for a refill.

## 2022-04-17 NOTE — Telephone Encounter (Signed)
Patient called, requesting refill for The Ambulatory Non Formulary Medication. Please advise.

## 2022-04-17 NOTE — Telephone Encounter (Signed)
Patient was last seen 05/06/21 and requesting a refill on her Dilitiazem. Please advise.

## 2022-04-21 MED ORDER — AMBULATORY NON FORMULARY MEDICATION: 1 refills | Status: DC

## 2022-04-21 NOTE — Telephone Encounter (Signed)
Refill faxed to [facility].

## 2022-04-22 MED ORDER — AMBULATORY NON FORMULARY MEDICATION: 1 refills | Status: AC

## 2022-04-22 NOTE — Addendum Note (Signed)
Addended by: Horris Latino on: 04/22/2022 10:26 AM   Modules accepted: Orders

## 2022-07-16 DIAGNOSIS — I10 Essential (primary) hypertension: Secondary | ICD-10-CM | POA: Diagnosis not present

## 2022-08-20 ENCOUNTER — Ambulatory Visit
Admission: RE | Admit: 2022-08-20 | Discharge: 2022-08-20 | Disposition: A | Discharge status: Home / Self Care | Payer: 59 | Source: Ambulatory Visit | Attending: Family Medicine | Admitting: Family Medicine

## 2022-08-20 DIAGNOSIS — N63 Unspecified lump in unspecified breast: Secondary | ICD-10-CM | POA: Diagnosis not present

## 2022-08-20 DIAGNOSIS — N6310 Unspecified lump in the right breast, unspecified quadrant: Secondary | ICD-10-CM

## 2022-08-20 DIAGNOSIS — R928 Other abnormal and inconclusive findings on diagnostic imaging of breast: Secondary | ICD-10-CM

## 2022-09-02 ENCOUNTER — Other Ambulatory Visit: Payer: Self-pay | Admitting: Family Medicine

## 2022-09-02 DIAGNOSIS — N6310 Unspecified lump in the right breast, unspecified quadrant: Secondary | ICD-10-CM

## 2023-02-03 DIAGNOSIS — E7800 Pure hypercholesterolemia, unspecified: Secondary | ICD-10-CM | POA: Diagnosis not present

## 2023-02-03 DIAGNOSIS — I10 Essential (primary) hypertension: Secondary | ICD-10-CM | POA: Diagnosis not present

## 2023-03-08 ENCOUNTER — Ambulatory Visit
Admission: RE | Admit: 2023-03-08 | Discharge: 2023-03-08 | Disposition: A | Discharge status: Home / Self Care | Payer: 59 | Source: Ambulatory Visit | Attending: Family Medicine | Admitting: Family Medicine

## 2023-03-08 DIAGNOSIS — N6310 Unspecified lump in the right breast, unspecified quadrant: Secondary | ICD-10-CM

## 2023-03-08 DIAGNOSIS — N6311 Unspecified lump in the right breast, upper outer quadrant: Secondary | ICD-10-CM | POA: Diagnosis not present

## 2023-04-23 DIAGNOSIS — I10 Essential (primary) hypertension: Secondary | ICD-10-CM | POA: Diagnosis not present

## 2023-08-04 DIAGNOSIS — I10 Essential (primary) hypertension: Secondary | ICD-10-CM | POA: Diagnosis not present

## 2023-08-04 DIAGNOSIS — E7800 Pure hypercholesterolemia, unspecified: Secondary | ICD-10-CM | POA: Diagnosis not present

## 2023-08-04 DIAGNOSIS — R5383 Other fatigue: Secondary | ICD-10-CM | POA: Diagnosis not present

## 2023-09-15 NOTE — Progress Notes (Addendum)
 09/16/2023 Andrea Palmer [id number] 1971-03-18  Referring provider: Dorena Gander, MD Primary GI doctor: Dr. Yvone Palmer (Dr. Savannah Palmer)  ASSESSMENT AND PLAN:  Constipation Recall colon 2028 Possible pelvic floor component went to pelvic PT for 2 months without help States she has no urge to have a BM, she states she needs to take a laxative to have urge to have a BM, still on miralax to keep stool soft No burning/itching/rectal pain with BM, rectal exam with large stool burden, no masses, negative FOBT No hematochezia, melena, weight loss, no AB pain Samples of linzess still had to take laxative - Increase fiber/ water intake, decrease caffeine, increase activity level. -Will give samples of isbrella, consider motegrity -Consider anal rectal manometry  History of Well-differentiated, low grade rectal neuroendocrine tumor -resected 7/21 chromogranin A 64.1 01/24/20 no residual tumor on EUS 12/22 surveillance recommended in 2024 -Will reach out to Andrea Palmer and his nurse to schedule follow up rectal EUS - will recheck chromogranin A  History of hepatic lesions on CT MRI with and without contrast 07/01/20 showed multiple T2 hyperintense liver lesions favoring cysts, the largest measuring 1.2 cm. Other lesions are too small to characterize. No evidence for liver mets.  LFTs normal, recheck today  History of rectal pain Improved with empiric treatment of fissures No pain at this time Negative FOBT  Personal history of tubular adenomatous polyps 08/31/2019 colonoscopy 1 mm ascending colon tubular adenoma, a 4 mm submucosal rectal well-differentiated neuroendocrine tumor, and internal hemorrhoids. Ki-67 was <1%.  Recall 2028 unless indications for sooner  I have reviewed the clinic note as outlined by Andrea Cull, PA and agree with the assessment, plan and medical decision making.  Ms. Screws returns to the office for follow-up of multiple GI issues-constipation, lack of  urge to defecate, bloating and history of low-grade rectal neuroendocrine tumor s/p resection 2021.  Using MiraLAX -did not tolerate Linzess.  No anorectal pain.  Agree with conservative management with the use of fiber.  Unfortunately, pelvic floor PT has not been helpful.  Agree with scheduling for follow-up rectal EUS.  Andrea Hess, MD   Patient Care Team: Andrea Gander, MD as PCP - General (Family Medicine) Andrea Soda, MD as Consulting Physician (Obstetrics and Gynecology)  HISTORY OF PRESENT ILLNESS: 53 y.o. female with a past medical history listed below presents for evaluation of constipation.   Last seen in the office May 06, 2021 by Dr. Savannah Palmer for benign neuroendocrine rectal tumor and constipation  Discussed the use of AI scribe software for clinical note transcription with the patient, who gave verbal consent to proceed.  History of Present Illness   Andrea Palmer is a 53 year old female with a history of a low-grade rectal neuroendocrine tumor who presents with worsening constipation and lack of bowel movement sensation.  She has a history of a low-grade rectal neuroendocrine tumor diagnosed in 2021, which was resected. An endoscopic ultrasound (EUS) in 2022 showed favorable results, and she is due for another EUS in December 2024.  She experiences worsening constipation and a lack of sensation to have a bowel movement, which began before her tumor resection in 2021. She uses Miralax regularly to maintain stool softness but relies on Baxby to induce bowel movements. Pelvic floor physical therapy for two months did not improve her symptoms. No fecal incontinence, rectal pain, burning, itching, dark stools, blood in stools, weight loss, abdominal pain, shortness of breath, chest pain, heartburn, nausea, or vomiting.  She experiences bloating,  which she associates with delayed laxative use. Linzess samples were ineffective in inducing bowel movements, unlike her sister's  experience with the medication. She is hesitant to add more medications unless necessary.  Her current medications include Miralax, which she takes to maintain stool softness. She has not experienced any recent rectal pain or burning sensations that previously required the use of a prescribed cream.        She  reports that she has never smoked. She has never used smokeless tobacco. She reports that she does not drink alcohol and does not use drugs.  RELEVANT GI HISTORY, IMAGING AND LABS: Results   DIAGNOSTIC Endoscopic ultrasound: Normal (2022)      CBC    Component Value Date/Time   WBC 5.5 01/31/2020 1120   WBC 5.2 12/23/2009 1920   RBC 4.22 01/31/2020 1120   RBC 3.90 12/23/2009 1920   HGB 13.6 01/31/2020 1120   HCT 41.1 01/31/2020 1120   PLT 262 01/31/2020 1120   MCV 97 01/31/2020 1120   MCH 32.2 01/31/2020 1120   MCHC 33.1 01/31/2020 1120   MCHC 32.1 12/23/2009 1920   RDW 11.9 01/31/2020 1120   LYMPHSABS 1.5 04/29/2017 1431   MONOABS 0.4 12/23/2009 1920   EOSABS 0.0 04/29/2017 1431   BASOSABS 0.0 04/29/2017 1431   No results for input(s): HGB in the last 8760 hours.  CMP     Component Value Date/Time   NA 143 06/04/2020 0956   K 4.6 06/04/2020 0956   CL 105 06/04/2020 0956   CO2 23 06/04/2020 0956   GLUCOSE 79 06/04/2020 0956   GLUCOSE 98 12/23/2009 1920   BUN 15 06/04/2020 0956   CREATININE 0.82 06/04/2020 0956   CALCIUM  10.1 06/04/2020 0956   PROT 6.7 06/04/2020 0956   ALBUMIN 4.4 06/04/2020 0956   AST 18 06/04/2020 0956   ALT 13 06/04/2020 0956   ALKPHOS 63 06/04/2020 0956   BILITOT 0.2 06/04/2020 0956   GFRNONAA 87 01/31/2020 1120   GFRAA 100 01/31/2020 1120      Latest Ref Rng & Units 06/04/2020    9:56 AM 01/31/2020   11:20 AM 07/05/2019   10:20 AM  Hepatic Function  Total Protein 6.0 - 8.5 g/dL 6.7  6.8  6.8   Albumin 3.8 - 4.8 g/dL 4.4  4.3  4.4   AST 0 - 40 IU/L 18  17  19    ALT 0 - 32 IU/L 13  12  13    Alk Phosphatase 44 - 121 IU/L 63   57  54   Total Bilirubin 0.0 - 1.2 mg/dL 0.2  0.3  0.3       Current Medications:    Current Outpatient Medications (Cardiovascular):    amLODipine (NORVASC) 5 MG tablet, Take 5 mg by mouth daily.   rosuvastatin  (CRESTOR ) 20 MG tablet, Take 20 mg by mouth daily.   Current Outpatient Medications (Analgesics):    aspirin EC 81 MG tablet, Take 81 mg by mouth daily. Swallow whole.   Current Outpatient Medications (Other):    AMBULATORY NON FORMULARY MEDICATION, Diltazem with Lidocaine  5% - Using your index finger, apply a small amount of medication inside the rectum up to your first knuckle/joint four times daily x 4 weeks.   bisacodyl (DULCOLAX) 5 MG EC tablet, Take 20 mg by mouth every three (3) days as needed for moderate constipation.   Cholecalciferol (VITAMIN D3) 50 MCG (2000 UT) TABS, Take 1 tablet by mouth daily.   OLANZapine  (ZYPREXA ) 2.5 MG tablet,  Take 2.5 mg by mouth at bedtime.   polyethylene glycol (MIRALAX / GLYCOLAX) 17 g packet, Take 17 g by mouth as needed.   Potassium Chloride ER 20 MEQ TBCR, Take 1 tablet by mouth daily.   Wheat Dextrin (BENEFIBER) POWD, Take 1 Dose by mouth every other day.  Medical History:  Past Medical History:  Diagnosis Date   Anxiety    HTN (hypertension)    Allergies: No Known Allergies   Surgical History:  She  has a past surgical history that includes Tubal ligation; Colonoscopy; Endoscopic mucosal resection (N/A, 10/23/2019); EUS (N/A, 10/23/2019); Flexible sigmoidoscopy (N/A, 10/23/2019); Submucosal lifting injection (10/23/2019); Hemostasis clip placement (10/23/2019); EUS (N/A, 03/17/2021); Flexible sigmoidoscopy (N/A, 03/17/2021); and biopsy (03/17/2021). Family History:  Her family history includes Congestive Heart Failure in her mother; Hypertension in her sister.  REVIEW OF SYSTEMS  : All other systems reviewed and negative except where noted in the History of Present Illness.  PHYSICAL EXAM: BP 130/88 (BP Location: Left Arm,  Patient Position: Sitting, Cuff Size: Normal)   Pulse 72   Ht 5' 1.75 (1.568 m) Comment: height measured without shoes  Wt 155 lb 2 oz (70.4 kg)   BMI 28.60 kg/m  Physical Exam   GENERAL APPEARANCE: Well nourished, in no apparent distress. HEENT: No cervical lymphadenopathy, unremarkable thyroid, sclerae anicteric, conjunctiva pink. RESPIRATORY: Respiratory effort normal, breath sounds equal bilaterally without rales, rhonchi, or wheezing. CARDIO: Regular rate and rhythm with no murmurs, rubs, or gallops, peripheral pulses intact. ABDOMEN: Soft, non-distended, active bowel sounds in all four quadrants, no tenderness to palpation, no rebound, no mass appreciated. RECTAL: No anal fissures or lesions, no rectal masses, hard stool present, rectal tone slightly reduced. MUSCULOSKELETAL: Full range of motion, normal gait, without edema. SKIN: Dry, intact without rashes or lesions. No jaundice. NEURO: Alert, oriented, no focal deficits. PSYCH: Cooperative, normal mood and affect.      Edmonia Gottron, PA-C 9:49 AM

## 2023-09-16 ENCOUNTER — Ambulatory Visit: Admitting: Physician Assistant

## 2023-09-16 ENCOUNTER — Other Ambulatory Visit

## 2023-09-16 VITALS — BP 130/88 | HR 72 | Ht 61.75 in | Wt 155.1 lb

## 2023-09-16 DIAGNOSIS — Z8719 Personal history of other diseases of the digestive system: Secondary | ICD-10-CM | POA: Diagnosis not present

## 2023-09-16 DIAGNOSIS — K5900 Constipation, unspecified: Secondary | ICD-10-CM

## 2023-09-16 DIAGNOSIS — K5904 Chronic idiopathic constipation: Secondary | ICD-10-CM

## 2023-09-16 DIAGNOSIS — Z860101 Personal history of adenomatous and serrated colon polyps: Secondary | ICD-10-CM

## 2023-09-16 DIAGNOSIS — Z85038 Personal history of other malignant neoplasm of large intestine: Secondary | ICD-10-CM | POA: Diagnosis not present

## 2023-09-16 DIAGNOSIS — Z86018 Personal history of other benign neoplasm: Secondary | ICD-10-CM | POA: Diagnosis not present

## 2023-09-16 NOTE — Patient Instructions (Addendum)
 _______________________________________________________  If your blood pressure at your visit was 140/90 or greater, please contact your primary care physician to follow up on this.  _______________________________________________________  If you are age 54 or older, your body mass index should be between 23-30. Your Body mass index is 28.6 kg/m. If this is out of the aforementioned range listed, please consider follow up with your Primary Care Provider.  If you are age 34 or younger, your body mass index should be between 19-25. Your Body mass index is 28.6 kg/m. If this is out of the aformentioned range listed, please consider follow up with your Primary Care Provider.   ________________________________________________________  The [facility] providers would like to encourage you to use MYCHART to communicate with providers for non-urgent requests or questions.  Due to long hold times on the telephone, sending your provider a message by [facility] may be a faster and more efficient way to get a response.  Please allow 48 business hours for a response.  Please remember that this is for non-urgent requests.  _______________________________________________________  Your provider has requested that you go to the basement level for lab work before leaving today. Press B on the elevator. The lab is located at the first door on the left as you exit the elevator.  Please follow up in 3-4 months. Give us  a call at [phone] to schedule an appointment.  We have given you samples of the following medication to take: Ibsrela  Can do trial of isbrela for IBS constipation Take Ibsrela (tenapanor) 5 to 10 minutes before eating breakfast and dinner. This can make the medication work better for you compared to taking it on an empty stomach Stop if you have any significant diarrhea/dehydration or call us  for fecal impaction/bowel prep medication  Recommend starting on a fiber supplement, can try  metamucil first but if this causes gas/bloating switch to benefiber or citracel, these do not cause gas.  Take with fiber with with a full 8 oz glass of water once a day. This can take 1 month to start helping, so try for at least one month.  Recommend increasing water and physical activity.   - Drink at least 64-80 ounces of water/liquid per day. - Establish a time to try to move your bowels every day.  For many people, this is after a cup of coffee or after a meal such as breakfast. - Sit all of the way back on the toilet keeping your back fairly straight and while sitting up, try to rest the tops of your forearms on your upper thighs.   - Raising your feet with a step stool/squatty potty can be helpful to improve the angle that allows your stool to pass through the rectum. - Relax the rectum feeling it bulge toward the toilet water.  If you feel your rectum raising toward your body, you are contracting rather than relaxing. - Breathe in and slowly exhale. Belly breath by expanding your belly towards your belly button. Keep belly expanded as you gently direct pressure down and back to the anus.  A low pitched GRRR sound can assist with increasing intra-abdominal pressure.  (Can also trying to blow on a pinwheel and make it move, this helps with the same belly breathing) - Repeat 3-4 times. If unsuccessful, contract the pelvic floor to restore normal tone and get off the toilet.  Avoid excessive straining. - To reduce excessive wiping by teaching your anus to normally contract, place hands on outer aspect of knees and  resist knee movement outward.  Hold 5-10 second then place hands just inside of knees and resist inward movement of knees.  Hold 5 seconds.  Repeat a few times each way.  Go to the ER if unable to pass gas, severe AB pain, unable to hold down food, any shortness of breath of chest pain.  It was a pleasure to see you today!  Thank you for trusting me with your gastrointestinal care!

## 2023-09-17 ENCOUNTER — Other Ambulatory Visit: Payer: Self-pay

## 2023-09-17 ENCOUNTER — Telehealth: Payer: Self-pay

## 2023-09-17 DIAGNOSIS — Z85038 Personal history of other malignant neoplasm of large intestine: Secondary | ICD-10-CM

## 2023-09-17 LAB — CBC WITH DIFFERENTIAL/PLATELET
Absolute Lymphocytes: 2231 {cells}/uL (ref 850–3900)
Absolute Monocytes: 318 {cells}/uL (ref 200–950)
Basophils Absolute: 21 {cells}/uL (ref 0–200)
Basophils Relative: 0.4 %
Eosinophils Absolute: 32 {cells}/uL (ref 15–500)
Eosinophils Relative: 0.6 %
HCT: 40.6 % (ref 35.0–45.0)
Hemoglobin: 12.9 g/dL (ref 11.7–15.5)
MCH: 30.9 pg (ref 27.0–33.0)
MCHC: 31.8 g/dL — ABNORMAL LOW (ref 32.0–36.0)
MCV: 97.4 fL (ref 80.0–100.0)
MPV: 10.2 fL (ref 7.5–12.5)
Monocytes Relative: 6 %
Neutro Abs: 2698 {cells}/uL (ref 1500–7800)
Neutrophils Relative %: 50.9 %
Platelets: 273 10*3/uL (ref 140–400)
RBC: 4.17 10*6/uL (ref 3.80–5.10)
RDW: 12.1 % (ref 11.0–15.0)
Total Lymphocyte: 42.1 %
WBC: 5.3 10*3/uL (ref 3.8–10.8)

## 2023-09-17 LAB — CHROMOGRANIN A: Chromogranin A (ng/mL): 59.1 ng/mL (ref 0.0–101.8)

## 2023-09-17 LAB — COMPREHENSIVE METABOLIC PANEL WITH GFR
AG Ratio: 1.7 (calc) (ref 1.0–2.5)
ALT: 12 U/L (ref 6–29)
AST: 16 U/L (ref 10–35)
Albumin: 4.4 g/dL (ref 3.6–5.1)
Alkaline phosphatase (APISO): 70 U/L (ref 37–153)
BUN: 18 mg/dL (ref 7–25)
CO2: 26 mmol/L (ref 20–32)
Calcium: 10.6 mg/dL — ABNORMAL HIGH (ref 8.6–10.4)
Chloride: 107 mmol/L (ref 98–110)
Creat: 0.9 mg/dL (ref 0.50–1.03)
Globulin: 2.6 g/dL (ref 1.9–3.7)
Glucose, Bld: 87 mg/dL (ref 65–99)
Potassium: 3.9 mmol/L (ref 3.5–5.3)
Sodium: 141 mmol/L (ref 135–146)
Total Bilirubin: 0.4 mg/dL (ref 0.2–1.2)
Total Protein: 7 g/dL (ref 6.1–8.1)
eGFR: 76 mL/min/{1.73_m2} (ref >=60)

## 2023-09-17 LAB — TSH: TSH: 2.19 m[IU]/L

## 2023-09-17 NOTE — Telephone Encounter (Signed)
-----   Message from [facility] sent at 09/17/2023  6:19 AM EDT ----- ARC, Agree. We had wanted her to get done in 12/24.  Emett Stapel, Please work on scheduling follow-up rectal EUS next available. GM ----- Message ----- From: Edmonia Gottron, PA-C Sent: 09/16/2023   9:53 AM EDT To: Aneita Keens, RN; Normie Becton., #  Previous Dr. Savannah Curlin, now Dr. Yvone Herd, needs follow up rectal EUS scheduled with Dr. Brice Campi, forwarding this to Dr. Brice Campi and Russ Course.  Thanks

## 2023-09-17 NOTE — Telephone Encounter (Signed)
 Lower EUS has been added to 11/25/23 at 1230 pm at [facility] with GM

## 2023-09-20 ENCOUNTER — Ambulatory Visit: Payer: Self-pay | Admitting: Physician Assistant

## 2023-09-20 DIAGNOSIS — K5904 Chronic idiopathic constipation: Secondary | ICD-10-CM

## 2023-09-20 NOTE — Telephone Encounter (Signed)
 Pt advised via My Chart- she does check messages. She did request all information sent to My Chart

## 2023-09-30 ENCOUNTER — Other Ambulatory Visit: Payer: Self-pay | Admitting: Physician Assistant

## 2023-09-30 DIAGNOSIS — K5904 Chronic idiopathic constipation: Secondary | ICD-10-CM

## 2023-09-30 MED ORDER — PRUCALOPRIDE SUCCINATE 2 MG PO TABS: 2.0000 mg | ORAL_TABLET | Freq: Every day | ORAL | 0 refills | Status: DC

## 2023-09-30 NOTE — Telephone Encounter (Signed)
 Isbrella samples did not help patient Will do motegrity

## 2023-10-04 ENCOUNTER — Other Ambulatory Visit (HOSPITAL_COMMUNITY): Payer: Self-pay

## 2023-10-04 ENCOUNTER — Telehealth: Payer: Self-pay

## 2023-10-04 NOTE — Telephone Encounter (Signed)
 Pharmacy Patient Advocate Encounter   Received notification from CoverMyMeds that prior authorization for Lubiprostone  capsules is required/requested.   Insurance verification completed.   The patient is insured through [facility] .   Prior Authorization for Lubiprostone  capsules has been APPROVED from 10-04-2023 to 10-03-2026   PA #/Case ID/Reference #: [id number]

## 2023-11-16 ENCOUNTER — Encounter (HOSPITAL_COMMUNITY): Payer: Self-pay | Admitting: Gastroenterology

## 2023-11-17 ENCOUNTER — Telehealth: Payer: Self-pay | Admitting: Gastroenterology

## 2023-11-17 NOTE — Telephone Encounter (Signed)
 Procedure:Upper EUS Procedure date: 11/25/23 Procedure location: [facility] Arrival Time: 11:15 am Spoke with the patient Y/N: Yes Any prep concerns? No  Has the patient obtained the prep from the pharmacy ? No prep needed Do you have a care partner and transportation: Yes Any additional concerns? No

## 2023-11-24 ENCOUNTER — Telehealth: Payer: Self-pay | Admitting: Gastroenterology

## 2023-11-24 NOTE — Telephone Encounter (Signed)
 Spoke with patient. Patient had questions regarding prep instructions. She wanted to know what time was considered dinner. Advised patient that this was around 5:00 PM. Verbalized understanding.

## 2023-11-24 NOTE — Anesthesia Preprocedure Evaluation (Signed)
 Anesthesia Evaluation  Patient identified by MRN, date of birth, ID band Patient awake    Reviewed: Allergy & Precautions, NPO status , Patient's Chart, lab work & pertinent test results  History of Anesthesia Complications Negative for: history of anesthetic complications  Airway Mallampati: II  TM Distance: >3 FB Neck ROM: Full    Dental  (+) Missing,    Pulmonary neg pulmonary ROS   Pulmonary exam normal        Cardiovascular hypertension, Pt. on medications Normal cardiovascular exam     Neuro/Psych   Anxiety     negative neurological ROS     GI/Hepatic Neg liver ROS,,,Neuroendocrine tumor    Endo/Other  negative endocrine ROS    Renal/GU negative Renal ROS     Musculoskeletal negative musculoskeletal ROS (+)    Abdominal   Peds  Hematology negative hematology ROS (+)   Anesthesia Other Findings Day of surgery medications reviewed with patient.  Reproductive/Obstetrics                              Anesthesia Physical Anesthesia Plan  ASA: 2  Anesthesia Plan: MAC   Post-op Pain Management: Minimal or no pain anticipated   Induction:   PONV Risk Score and Plan: Treatment may vary due to age or medical condition and Propofol  infusion  Airway Management Planned: Natural Airway and Nasal Cannula  Additional Equipment: None  Intra-op Plan:   Post-operative Plan:   Informed Consent: I have reviewed the patients History and Physical, chart, labs and discussed the procedure including the risks, benefits and alternatives for the proposed anesthesia with the patient or authorized representative who has indicated his/her understanding and acceptance.       Plan Discussed with: CRNA  Anesthesia Plan Comments:          Anesthesia Quick Evaluation

## 2023-11-24 NOTE — Telephone Encounter (Signed)
 Inbound call from patient stating she has some question on her prep instructions for upcoming procedure tomorrow at the hospital  Requesting a call back   Please advise  Thank you

## 2023-11-25 ENCOUNTER — Encounter (HOSPITAL_COMMUNITY): Payer: Self-pay | Admitting: Gastroenterology

## 2023-11-25 ENCOUNTER — Ambulatory Visit (HOSPITAL_COMMUNITY)
Admission: RE | Admit: 2023-11-25 | Discharge: 2023-11-25 | Disposition: A | Discharge status: Home / Self Care | Attending: Gastroenterology | Admitting: Gastroenterology

## 2023-11-25 ENCOUNTER — Encounter (HOSPITAL_COMMUNITY)
Admission: RE | Disposition: A | Discharge status: Home / Self Care | Payer: Self-pay | Source: Home / Self Care | Attending: Gastroenterology

## 2023-11-25 ENCOUNTER — Encounter (HOSPITAL_COMMUNITY): Payer: Self-pay | Admitting: Anesthesiology

## 2023-11-25 ENCOUNTER — Other Ambulatory Visit: Payer: Self-pay

## 2023-11-25 ENCOUNTER — Ambulatory Visit (HOSPITAL_BASED_OUTPATIENT_CLINIC_OR_DEPARTMENT_OTHER): Payer: Self-pay | Admitting: Anesthesiology

## 2023-11-25 DIAGNOSIS — Z8719 Personal history of other diseases of the digestive system: Secondary | ICD-10-CM | POA: Insufficient documentation

## 2023-11-25 DIAGNOSIS — Z85048 Personal history of other malignant neoplasm of rectum, rectosigmoid junction, and anus: Secondary | ICD-10-CM | POA: Insufficient documentation

## 2023-11-25 DIAGNOSIS — I899 Noninfective disorder of lymphatic vessels and lymph nodes, unspecified: Secondary | ICD-10-CM

## 2023-11-25 DIAGNOSIS — Z85040 Personal history of malignant carcinoid tumor of rectum: Secondary | ICD-10-CM

## 2023-11-25 DIAGNOSIS — Z79899 Other long term (current) drug therapy: Secondary | ICD-10-CM | POA: Diagnosis not present

## 2023-11-25 DIAGNOSIS — Z9889 Other specified postprocedural states: Secondary | ICD-10-CM | POA: Diagnosis not present

## 2023-11-25 DIAGNOSIS — K641 Second degree hemorrhoids: Secondary | ICD-10-CM | POA: Diagnosis not present

## 2023-11-25 DIAGNOSIS — I10 Essential (primary) hypertension: Secondary | ICD-10-CM

## 2023-11-25 DIAGNOSIS — Z85038 Personal history of other malignant neoplasm of large intestine: Secondary | ICD-10-CM

## 2023-11-25 DIAGNOSIS — Z09 Encounter for follow-up examination after completed treatment for conditions other than malignant neoplasm: Secondary | ICD-10-CM | POA: Insufficient documentation

## 2023-11-25 DIAGNOSIS — Z860101 Personal history of adenomatous and serrated colon polyps: Secondary | ICD-10-CM

## 2023-11-25 HISTORY — PX: EUS: SHX5427

## 2023-11-25 HISTORY — PX: FLEXIBLE SIGMOIDOSCOPY: SHX5431

## 2023-11-25 SURGERY — ULTRASOUND, LOWER GI TRACT, ENDOSCOPIC
Anesthesia: Monitor Anesthesia Care

## 2023-11-25 MED ORDER — FENTANYL CITRATE (PF) 100 MCG/2ML IJ SOLN
INTRAMUSCULAR | Status: AC
Start: 2023-11-25 — End: 2023-11-25
  Filled 2023-11-25: qty 2

## 2023-11-25 MED ORDER — PROPOFOL 10 MG/ML IV BOLUS
INTRAVENOUS | Status: DC | PRN
  Administered 2023-11-25: 20 mg via INTRAVENOUS
  Administered 2023-11-25: 30 mg via INTRAVENOUS

## 2023-11-25 MED ORDER — SODIUM CHLORIDE 0.9 % IV SOLN: INTRAVENOUS | Status: DC

## 2023-11-25 MED ORDER — LIDOCAINE 2% (20 MG/ML) 5 ML SYRINGE
INTRAMUSCULAR | Status: DC | PRN
  Administered 2023-11-25: 80 mg via INTRAVENOUS

## 2023-11-25 MED ORDER — PROPOFOL 500 MG/50ML IV EMUL
INTRAVENOUS | Status: DC | PRN
  Administered 2023-11-25: 150 ug/kg/min via INTRAVENOUS

## 2023-11-25 MED ORDER — MIDAZOLAM HCL 2 MG/2ML IJ SOLN
INTRAMUSCULAR | Status: AC
  Filled 2023-11-25: qty 2

## 2023-11-25 MED ORDER — PROPOFOL 10 MG/ML IV BOLUS
INTRAVENOUS | Status: AC
  Filled 2023-11-25: qty 20

## 2023-11-25 NOTE — Anesthesia Postprocedure Evaluation (Signed)
 Anesthesia Post Note  Patient: Andrea Palmer  Procedure(s) Performed: ULTRASOUND, LOWER GI TRACT, ENDOSCOPIC SIGMOIDOSCOPY, FLEXIBLE     Patient location during evaluation: PACU Anesthesia Type: MAC Level of consciousness: awake and alert Pain management: pain level controlled Vital Signs Assessment: post-procedure vital signs reviewed and stable Respiratory status: spontaneous breathing, nonlabored ventilation and respiratory function stable Cardiovascular status: blood pressure returned to baseline Postop Assessment: no apparent nausea or vomiting Anesthetic complications: no   No notable events documented.  Last Vitals:  Vitals:   11/25/23 1230 11/25/23 1240  BP: 112/76 (!) 128/94  Pulse: (!) 57 61  Resp: 19 19  Temp:    SpO2: 100% 99%    Last Pain:  Vitals:   11/25/23 1240  TempSrc:   PainSc: 0-No pain                 Vertell Row

## 2023-11-25 NOTE — Transfer of Care (Signed)
 Immediate Anesthesia Transfer of Care Note  Patient: Andrea Palmer  Procedure(s) Performed: ULTRASOUND, LOWER GI TRACT, ENDOSCOPIC SIGMOIDOSCOPY, FLEXIBLE  Patient Location: PACU  Anesthesia Type:MAC  Level of Consciousness: sedated, patient cooperative, and responds to stimulation  Airway & Oxygen Therapy: Patient Spontanous Breathing and Patient connected to face mask oxygen  Post-op Assessment: Report given to RN and Post -op Vital signs reviewed and stable  Post vital signs: Reviewed and stable  Last Vitals:  Vitals Value Taken Time  BP 89/62 11/25/23 12:16  Temp 36.1 C 11/25/23 12:16  Pulse 59 11/25/23 12:18  Resp 17 11/25/23 12:18  SpO2 100 % 11/25/23 12:18  Vitals shown include unfiled device data.  Last Pain:  Vitals:   11/25/23 1216  TempSrc: Temporal  PainSc:          Complications: No notable events documented.

## 2023-11-25 NOTE — Discharge Instructions (Signed)

## 2023-11-25 NOTE — Op Note (Signed)
 [facility] Patient Name: Andrea Palmer Procedure Date: 11/25/2023 MRN: [id number] Attending MD: Aloha Finner , MD, [id number] Date of Birth: [birth year] CSN: [id number] Age: 53 Admit Type: Outpatient Procedure:                Lower EUS Indications:              Post-op endosonographic surveillance for potential                            anorectal carcinoma recurrence, Neuroendocrine Tumor Providers:                Aloha Finner, MD, Ozell Pouch, Lorrayne Kitty, Technician Referring MD:              Medicines:                Monitored Anesthesia Care Complications:            No immediate complications. Estimated Blood Loss:     Estimated blood loss was minimal. Estimated blood                            loss: none. Procedure:                Pre-Anesthesia Assessment:                           - Prior to the procedure, a History and Physical                            was performed, and patient medications and                            allergies were reviewed. The patient's tolerance of                            previous anesthesia was also reviewed. The risks                            and benefits of the procedure and the sedation                            options and risks were discussed with the patient.                            All questions were answered, and informed consent                            was obtained. Prior Anticoagulants: The patient has                            taken no anticoagulant or antiplatelet agents. ASA                            Grade Assessment: II -  A patient with mild systemic                            disease. After reviewing the risks and benefits,                            the patient was deemed in satisfactory condition to                            undergo the procedure.                           After obtaining informed consent, the endoscope was                            passed  under direct vision. Throughout the                            procedure, the patient's blood pressure, pulse, and                            oxygen saturations were monitored continuously. The                            [id number]-[id number] ([id number]) Olympus colonoscope was                            introduced through the anus and advanced to the the                            descending colon. The [id number] ([id number])                            Olympus endosonoscope was introduced through the                            anus and advanced to the the sigmoid colon for                            ultrasound. The lower EUS was accomplished without                            difficulty. The patient tolerated the procedure.                            The quality of the bowel preparation was adequate. Scope In: 11:53:54 AM Scope Out: 12:17:02 PM Total Procedure Duration: 0 hours 23 minutes 8 seconds  Findings:      The digital rectal exam findings include hemorrhoids. Pertinent       negatives include no palpable rectal lesions.      ENDOSCOPIC FINDING: :      A large amount of semi-liquid stool was found in the entire colon,       interfering with visualization. Lavage of the area was performed using  copious amounts, resulting in clearance with adequate visualization.      A medium post mucosectomy scar was found in the distal rectum. The scar       tissue was healthy in appearance.      Non-bleeding non-thrombosed internal hemorrhoids were found during       retroflexion, during perianal exam and during digital exam. The       hemorrhoids were Grade II (internal hemorrhoids that prolapse but reduce       spontaneously).      ENDOSONOGRAPHIC FINDING: :      The rectum was normal.      The perirectal space was normal.      The rectosigmoid junction was normal.      No malignant-appearing lymph nodes were visualized in the perirectal       region and in the left iliac region.      The internal  anal sphincter was visualized endosonographically and       appeared normal. Impression:               FLEX Impression:                           - Hemorrhoids found on digital rectal exam.                           - Stool in the entire examined colon.                           - Post mucosectomy scar in the distal rectum.                           - Non-bleeding non-thrombosed internal hemorrhoids.                           EUS Impression:                           - Endosonographic images of the rectum were                            unremarkable.                           - Endosonographic images of the perirectal space                            were unremarkable.                           - Endosonographic imaging showed no sign of                            significant pathology at the rectosigmoid junction.                           - No malignant-appearing lymph nodes were                            visualized endosonographically in  the perirectal                            region and in the left iliac region.                           - The internal anal sphincter was visualized                            endosonographically and appeared normal. Moderate Sedation:      Not Applicable - Patient had care per Anesthesia. Recommendation:           - The patient will be observed post-procedure,                            until all discharge criteria are met.                           - Discharge patient to home.                           - Patient has a contact number available for                            emergencies. The signs and symptoms of potential                            delayed complications were discussed with the                            patient. Return to normal activities tomorrow.                            Written discharge instructions were provided to the                            patient.                           - Resume previous diet.                            - Plan for repeat lower EUS with full colonoscopy                            in 2 years (history of previous adenomas), will                            need full colon preparation.                           - The findings and recommendations were discussed                            with the patient.                           -  The findings and recommendations were discussed                            with the patient's family. Procedure Code(s):        --- Professional ---                           [phone], Sigmoidoscopy, flexible; with endoscopic                            ultrasound examination Diagnosis Code(s):        --- Professional ---                           K64.1, Second degree hemorrhoids                           Z98.890, Other specified postprocedural states                           I89.9, Noninfective disorder of lymphatic vessels                            and lymph nodes, unspecified                           Z08, Encounter for follow-up examination after                            completed treatment for malignant neoplasm                           Z85.048, Personal history of other malignant                            neoplasm of rectum, rectosigmoid junction, and anus CPT copyright 2022 [employer]. All rights reserved. The codes documented in this report are preliminary and upon coder review may  be revised to meet current compliance requirements. Aloha Finner, MD 11/25/2023 12:29:31 PM Number of Addenda: 0

## 2023-11-25 NOTE — H&P (Signed)
 GASTROENTEROLOGY PROCEDURE H&P NOTE   Primary Care Physician: Rolinda Millman, MD  HPI: Andrea Palmer is a 53 y.o. 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  Past Medical History:  Diagnosis Date   Anxiety    HTN (hypertension)    Past Surgical History:  Procedure Laterality Date   BIOPSY  03/17/2021   Procedure: BIOPSY;  Surgeon: Wilhelmenia Aloha Raddle., MD;  Location: [facility];  Service: Gastroenterology;;   COLONOSCOPY     ENDOSCOPIC MUCOSAL RESECTION N/A 10/23/2019   Procedure: ENDOSCOPIC MUCOSAL RESECTION;  Surgeon: Wilhelmenia Aloha Raddle., MD;  Location: [facility] ENDOSCOPY;  Service: Gastroenterology;  Laterality: N/A;   EUS N/A 10/23/2019   Procedure: LOWER ENDOSCOPIC ULTRASOUND (EUS);  Surgeon: Wilhelmenia Aloha Raddle., MD;  Location: [facility] ENDOSCOPY;  Service: Gastroenterology;  Laterality: N/A;   EUS N/A 03/17/2021   Procedure: LOWER ENDOSCOPIC ULTRASOUND (EUS);  Surgeon: Wilhelmenia Aloha Raddle., MD;  Location: [facility];  Service: Gastroenterology;  Laterality: N/A;   FLEXIBLE SIGMOIDOSCOPY N/A 10/23/2019   Procedure: FLEXIBLE SIGMOIDOSCOPY;  Surgeon: Wilhelmenia Aloha Raddle., MD;  Location: [facility] ENDOSCOPY;  Service: Gastroenterology;  Laterality: N/A;   FLEXIBLE SIGMOIDOSCOPY N/A 03/17/2021   Procedure: FLEXIBLE SIGMOIDOSCOPY;  Surgeon: Wilhelmenia Aloha Raddle., MD;  Location: [facility];  Service: Gastroenterology;  Laterality: N/A;   HEMOSTASIS CLIP PLACEMENT  10/23/2019   Procedure: HEMOSTASIS CLIP PLACEMENT;  Surgeon: Wilhelmenia Aloha Raddle., MD;  Location: [facility] ENDOSCOPY;  Service: Gastroenterology;;   SUBMUCOSAL LIFTING INJECTION  10/23/2019   Procedure: SUBMUCOSAL LIFTING INJECTION;  Surgeon: Wilhelmenia Aloha Raddle., MD;  Location: [facility] ENDOSCOPY;  Service: Gastroenterology;;   TUBAL LIGATION     No current facility-administered medications for this encounter.   No current  facility-administered medications for this encounter. No Known Allergies Family History  Problem Relation Age of Onset   Congestive Heart Failure Mother    Hypertension Sister    Colon cancer Neg Hx    Esophageal cancer Neg Hx    Rectal cancer Neg Hx    Stomach cancer Neg Hx    Social History   Socioeconomic History   Marital status: Single    Spouse name: Not on file   Number of children: 2   Years of education: 14   Highest education level: Not on file  Occupational History   Occupation: Hair stylist   Tobacco Use   Smoking status: Never   Smokeless tobacco: Never  Vaping Use   Vaping status: Never Used  Substance and Sexual Activity   Alcohol use: No    Alcohol/week: 0.0 standard drinks of alcohol   Drug use: No   Sexual activity: Yes    Partners: Male    Birth control/protection: None  Other Topics Concern   Not on file  Social History Narrative   Born and raised in [place]. Currently resides in an apartment with her son. Has a rabbit. Fun: watch tv (law and order; criminal minds); Denies any religious beliefs that would effect health care.   Social Drivers of Corporate investment banker Strain: Not on file  Food Insecurity: Not on file  Transportation Needs: Not on file  Physical Activity: Not on file  Stress: Not on file  Social Connections: Not on file  Intimate Partner Violence: Not on file    Physical Exam: Today's Vitals   11/16/23 1126  Weight: 70.8 kg   Body mass index is 28.76 kg/m. GEN: NAD EYE: Sclerae anicteric  ENT: MMM CV: Non-tachycardic GI: Soft, NT/ND NEURO:  Alert & Oriented x 3  Lab Results: No results for input(s): WBC, HGB, HCT, PLT in the last 72 hours. BMET No results for input(s): NA, K, CL, CO2, GLUCOSE, BUN, CREATININE, CALCIUM  in the last 72 hours. LFT No results for input(s): PROT, ALBUMIN, AST, ALT, ALKPHOS, BILITOT, BILIDIR, IBILI in the last 72 hours. PT/INR No results  for input(s): LABPROT, INR in the last 72 hours.   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  The risks of an EUS including intestinal perforation, bleeding, infection, aspiration, and medication effects were discussed as was the possibility it may not give a definitive diagnosis if a biopsy is performed.  When a biopsy of the pancreas is done as part of the EUS, there is an additional risk of pancreatitis at the rate of about 1-2%.  It was explained that procedure related pancreatitis is typically mild, although it can be severe and even life threatening, which is why we do not perform random pancreatic biopsies and only biopsy a lesion/area we feel is concerning enough to warrant the risk.   The risks and benefits of endoscopic evaluation/treatment were discussed with the patient and/or family; these include but are not limited to the risk of perforation, infection, bleeding, missed lesions, lack of diagnosis, severe illness requiring hospitalization, as well as anesthesia and sedation related illnesses.  The patient's history has been reviewed, patient examined, no change in status, and deemed stable for procedure.  The patient and/or family is agreeable to proceed.    Aloha Finner, MD [facility] Advanced Endoscopy Office # [phone]

## 2023-11-28 ENCOUNTER — Encounter (HOSPITAL_COMMUNITY): Payer: Self-pay | Admitting: Gastroenterology

## 2023-11-29 ENCOUNTER — Telehealth: Payer: Self-pay

## 2023-11-29 NOTE — Telephone Encounter (Signed)
 Recall has been entered

## 2023-11-29 NOTE — Telephone Encounter (Signed)
-----   Message from [facility] sent at 11/25/2023 12:29 PM EDT ----- Regarding: Patient recall Andrea Palmer, This patient needs a 2-year Lower EUS followup for history of rectal neuroendocrine tumor. She will need full colonoscopy to be done at the same time due to history of prior adenomas. Thanks. GM

## 2024-01-24 ENCOUNTER — Other Ambulatory Visit: Payer: Self-pay | Admitting: Physician Assistant

## 2024-02-09 ENCOUNTER — Other Ambulatory Visit: Payer: Self-pay | Admitting: Family Medicine

## 2024-02-09 DIAGNOSIS — N63 Unspecified lump in unspecified breast: Secondary | ICD-10-CM

## 2024-03-10 DIAGNOSIS — I10 Essential (primary) hypertension: Secondary | ICD-10-CM | POA: Diagnosis not present

## 2024-03-10 DIAGNOSIS — R809 Proteinuria, unspecified: Secondary | ICD-10-CM | POA: Diagnosis not present

## 2024-03-10 DIAGNOSIS — I700 Atherosclerosis of aorta: Secondary | ICD-10-CM | POA: Diagnosis not present

## 2024-03-10 DIAGNOSIS — F419 Anxiety disorder, unspecified: Secondary | ICD-10-CM | POA: Diagnosis not present

## 2024-03-10 DIAGNOSIS — E559 Vitamin D deficiency, unspecified: Secondary | ICD-10-CM | POA: Diagnosis not present

## 2024-03-10 DIAGNOSIS — Z0000 Encounter for general adult medical examination without abnormal findings: Secondary | ICD-10-CM | POA: Diagnosis not present

## 2024-03-10 DIAGNOSIS — L989 Disorder of the skin and subcutaneous tissue, unspecified: Secondary | ICD-10-CM | POA: Diagnosis not present

## 2024-03-10 DIAGNOSIS — H6121 Impacted cerumen, right ear: Secondary | ICD-10-CM | POA: Diagnosis not present

## 2024-03-10 DIAGNOSIS — Z7185 Encounter for immunization safety counseling: Secondary | ICD-10-CM | POA: Diagnosis not present

## 2024-03-10 DIAGNOSIS — E7800 Pure hypercholesterolemia, unspecified: Secondary | ICD-10-CM | POA: Diagnosis not present

## 2024-03-14 ENCOUNTER — Ambulatory Visit
Admission: RE | Admit: 2024-03-14 | Discharge: 2024-03-14 | Disposition: A | Source: Ambulatory Visit | Attending: Family Medicine

## 2024-03-14 DIAGNOSIS — N6310 Unspecified lump in the right breast, unspecified quadrant: Secondary | ICD-10-CM | POA: Diagnosis not present

## 2024-03-14 DIAGNOSIS — N63 Unspecified lump in unspecified breast: Secondary | ICD-10-CM

## 2024-03-14 DIAGNOSIS — R928 Other abnormal and inconclusive findings on diagnostic imaging of breast: Secondary | ICD-10-CM | POA: Diagnosis not present
